# Patient Record
Sex: Male | Born: 1980
Health system: Southern US, Community
[De-identification: ages and names within clinical notes are randomized; demographics above are authoritative.]

## PROBLEM LIST (undated history)

## (undated) DIAGNOSIS — I2510 Atherosclerotic heart disease of native coronary artery without angina pectoris: Secondary | ICD-10-CM

## (undated) DIAGNOSIS — E7800 Pure hypercholesterolemia, unspecified: Secondary | ICD-10-CM

## (undated) DIAGNOSIS — F1910 Other psychoactive substance abuse, uncomplicated: Secondary | ICD-10-CM

## (undated) DIAGNOSIS — F32A Depression, unspecified: Secondary | ICD-10-CM

## (undated) DIAGNOSIS — F329 Major depressive disorder, single episode, unspecified: Secondary | ICD-10-CM

## (undated) HISTORY — PX: SURGERY SCROTAL / TESTICULAR: SUR1316

---

## 2019-03-07 ENCOUNTER — Other Ambulatory Visit: Payer: Self-pay

## 2019-03-07 ENCOUNTER — Encounter (HOSPITAL_COMMUNITY): Payer: Self-pay | Admitting: *Deleted

## 2019-03-07 ENCOUNTER — Emergency Department (HOSPITAL_COMMUNITY)
Admission: EM | Admit: 2019-03-07 | Discharge: 2019-03-08 | Disposition: A | Discharge status: Other Acute Inpatient Hospital | Payer: Self-pay | Attending: Emergency Medicine | Admitting: Emergency Medicine

## 2019-03-07 DIAGNOSIS — F17210 Nicotine dependence, cigarettes, uncomplicated: Secondary | ICD-10-CM | POA: Insufficient documentation

## 2019-03-07 DIAGNOSIS — R45851 Suicidal ideations: Secondary | ICD-10-CM | POA: Insufficient documentation

## 2019-03-07 DIAGNOSIS — R441 Visual hallucinations: Secondary | ICD-10-CM | POA: Insufficient documentation

## 2019-03-07 DIAGNOSIS — F333 Major depressive disorder, recurrent, severe with psychotic symptoms: Secondary | ICD-10-CM | POA: Insufficient documentation

## 2019-03-07 DIAGNOSIS — Z046 Encounter for general psychiatric examination, requested by authority: Secondary | ICD-10-CM | POA: Insufficient documentation

## 2019-03-07 HISTORY — DX: Major depressive disorder, single episode, unspecified: F32.9

## 2019-03-07 HISTORY — DX: Depression, unspecified: F32.A

## 2019-03-07 HISTORY — DX: Pure hypercholesterolemia, unspecified: E78.00

## 2019-03-07 LAB — CBC
HCT: 47.8 % (ref 39.0–52.0)
Hemoglobin: 16.2 g/dL (ref 13.0–17.0)
MCH: 32.3 pg (ref 26.0–34.0)
MCHC: 33.9 g/dL (ref 30.0–36.0)
MCV: 95.2 fL (ref 80.0–100.0)
Platelets: 252 10*3/uL (ref 150–400)
RBC: 5.02 MIL/uL (ref 4.22–5.81)
RDW: 12.2 % (ref 11.5–15.5)
WBC: 7 10*3/uL (ref 4.0–10.5)
nRBC: 0 % (ref 0.0–0.2)

## 2019-03-07 LAB — COMPREHENSIVE METABOLIC PANEL
ALT: 158 U/L — ABNORMAL HIGH (ref 0–44)
ANION GAP: 10 (ref 5–15)
AST: 71 U/L — ABNORMAL HIGH (ref 15–41)
Albumin: 5.1 g/dL — ABNORMAL HIGH (ref 3.5–5.0)
Alkaline Phosphatase: 46 U/L (ref 38–126)
BUN: 17 mg/dL (ref 6–20)
CO2: 27 mmol/L (ref 22–32)
Calcium: 9.8 mg/dL (ref 8.9–10.3)
Chloride: 102 mmol/L (ref 98–111)
Creatinine, Ser: 0.74 mg/dL (ref 0.61–1.24)
GFR calc Af Amer: >60 mL/min (ref >60)
GFR calc non Af Amer: >60 mL/min (ref >60)
Glucose, Bld: 102 mg/dL — ABNORMAL HIGH (ref 70–99)
Potassium: 3.8 mmol/L (ref 3.5–5.1)
Sodium: 139 mmol/L (ref 135–145)
Total Bilirubin: 1.5 mg/dL — ABNORMAL HIGH (ref 0.3–1.2)
Total Protein: 8.6 g/dL — ABNORMAL HIGH (ref 6.5–8.1)

## 2019-03-07 LAB — RAPID URINE DRUG SCREEN, HOSP PERFORMED
Amphetamines: POSITIVE — AB
Barbiturates: NOT DETECTED
Benzodiazepines: NOT DETECTED
Cocaine: NOT DETECTED
Opiates: NOT DETECTED
Tetrahydrocannabinol: NOT DETECTED

## 2019-03-07 LAB — ACETAMINOPHEN LEVEL: Acetaminophen (Tylenol), Serum: <10 ug/mL — ABNORMAL LOW (ref 10–30)

## 2019-03-07 LAB — ETHANOL

## 2019-03-07 LAB — SALICYLATE LEVEL: Salicylate Lvl: <7 mg/dL (ref 2.8–30.0)

## 2019-03-07 NOTE — ED Notes (Signed)
Pt alert and oriented, pt denies any pain or discomfort. Pt denies any hi and avh. Pt c/o si without a plan. Pt noted with a flat affect. Pt responds to questions slowly. Pt contract to safety, will continue to monitor.

## 2019-03-07 NOTE — ED Triage Notes (Signed)
Pt brought in by [employer].  Pt stated "I was supposed to see someone for an assessment last Monday but I didn't.  I don't work because I keep getting arrested because I date or marry women that see me as refuse or easily discarded.  I'm just tired of life.  I told my parents I was tired of being afraid and I just didn't want to do this anymore."  Pt denies having a plan to harm self.
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
## 2019-03-07 NOTE — BHH Counselor (Signed)
Pt reported, he does not have any family, friend supports clinician could contact to obtain collateral information.   Jon Peterson D [name], MS, [facility], CRC Triage Specialist [phone]  

## 2019-03-08 ENCOUNTER — Inpatient Hospital Stay (HOSPITAL_COMMUNITY)
Admission: AD | Admit: 2019-03-08 | Discharge: 2019-03-10 | DRG: 885 | Disposition: A | Discharge status: Home / Self Care | Payer: No Typology Code available for payment source | Source: Intra-hospital | Attending: Psychiatry | Admitting: Psychiatry

## 2019-03-08 ENCOUNTER — Encounter (HOSPITAL_COMMUNITY): Payer: Self-pay

## 2019-03-08 DIAGNOSIS — R45851 Suicidal ideations: Secondary | ICD-10-CM | POA: Diagnosis present

## 2019-03-08 DIAGNOSIS — Z915 Personal history of self-harm: Secondary | ICD-10-CM | POA: Diagnosis not present

## 2019-03-08 DIAGNOSIS — Z23 Encounter for immunization: Secondary | ICD-10-CM | POA: Diagnosis not present

## 2019-03-08 DIAGNOSIS — F1510 Other stimulant abuse, uncomplicated: Secondary | ICD-10-CM | POA: Diagnosis present

## 2019-03-08 DIAGNOSIS — F333 Major depressive disorder, recurrent, severe with psychotic symptoms: Secondary | ICD-10-CM | POA: Diagnosis present

## 2019-03-08 DIAGNOSIS — F17210 Nicotine dependence, cigarettes, uncomplicated: Secondary | ICD-10-CM | POA: Diagnosis present

## 2019-03-08 MED ORDER — ONDANSETRON HCL 4 MG PO TABS: 4.0000 mg | ORAL_TABLET | Freq: Three times a day (TID) | ORAL | Status: DC | PRN

## 2019-03-08 MED ORDER — MODAFINIL 100 MG PO TABS
100.0000 mg | ORAL_TABLET | Freq: Every day | ORAL | Status: DC
  Administered 2019-03-08 – 2019-03-10 (×3): 100 mg via ORAL
  Filled 2019-03-08 (×3): qty 1

## 2019-03-08 MED ORDER — NICOTINE 21 MG/24HR TD PT24
21.0000 mg | MEDICATED_PATCH | Freq: Every day | TRANSDERMAL | Status: DC
  Administered 2019-03-08 – 2019-03-10 (×3): 21 mg via TRANSDERMAL
  Filled 2019-03-08 (×6): qty 1

## 2019-03-08 MED ORDER — ALUM & MAG HYDROXIDE-SIMETH 200-200-20 MG/5ML PO SUSP: 30.0000 mL | ORAL | Status: DC | PRN

## 2019-03-08 MED ORDER — MAGNESIUM HYDROXIDE 400 MG/5ML PO SUSP: 30.0000 mL | Freq: Every day | ORAL | Status: DC | PRN

## 2019-03-08 MED ORDER — TRAZODONE HCL 100 MG PO TABS
100.0000 mg | ORAL_TABLET | Freq: Every evening | ORAL | Status: DC | PRN
  Administered 2019-03-08 – 2019-03-09 (×2): 100 mg via ORAL
  Filled 2019-03-08 (×7): qty 1

## 2019-03-08 MED ORDER — ALUM & MAG HYDROXIDE-SIMETH 200-200-20 MG/5ML PO SUSP: 30.0000 mL | Freq: Four times a day (QID) | ORAL | Status: DC | PRN

## 2019-03-08 MED ORDER — PNEUMOCOCCAL VAC POLYVALENT 25 MCG/0.5ML IJ INJ
0.5000 mL | INJECTION | INTRAMUSCULAR | Status: AC
  Administered 2019-03-09: 0.5 mL via INTRAMUSCULAR

## 2019-03-08 MED ORDER — PRENATAL MULTIVITAMIN CH
1.0000 | ORAL_TABLET | Freq: Every day | ORAL | Status: DC
  Administered 2019-03-08 – 2019-03-10 (×3): 1 via ORAL
  Filled 2019-03-08 (×4): qty 1

## 2019-03-08 MED ORDER — HYDROXYZINE HCL 25 MG PO TABS
25.0000 mg | ORAL_TABLET | Freq: Four times a day (QID) | ORAL | Status: DC | PRN
  Administered 2019-03-08 – 2019-03-09 (×2): 25 mg via ORAL
  Filled 2019-03-08 (×2): qty 1

## 2019-03-08 MED ORDER — IBUPROFEN 200 MG PO TABS: 600.0000 mg | ORAL_TABLET | Freq: Three times a day (TID) | ORAL | Status: DC | PRN

## 2019-03-08 MED ORDER — INFLUENZA VAC SPLIT QUAD 0.5 ML IM SUSY
0.5000 mL | PREFILLED_SYRINGE | INTRAMUSCULAR | Status: AC
  Administered 2019-03-09: 0.5 mL via INTRAMUSCULAR
  Filled 2019-03-08: qty 0.5

## 2019-03-08 MED ORDER — FLUOXETINE HCL 20 MG PO CAPS
20.0000 mg | ORAL_CAPSULE | Freq: Every day | ORAL | Status: DC
  Administered 2019-03-08 – 2019-03-10 (×3): 20 mg via ORAL
  Filled 2019-03-08 (×5): qty 1

## 2019-03-08 MED ORDER — TRAZODONE HCL 50 MG PO TABS
50.0000 mg | ORAL_TABLET | Freq: Every evening | ORAL | Status: DC | PRN
  Filled 2019-03-08 (×2): qty 1

## 2019-03-08 MED ORDER — NICOTINE 21 MG/24HR TD PT24: 21.0000 mg | MEDICATED_PATCH | Freq: Every day | TRANSDERMAL | Status: DC

## 2019-03-08 MED ORDER — ACETAMINOPHEN 325 MG PO TABS: 650.0000 mg | ORAL_TABLET | Freq: Four times a day (QID) | ORAL | Status: DC | PRN
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
## 2019-03-08 NOTE — Progress Notes (Addendum)
Jon Peterson attended wrap-up group. Pt denies SI/HI/AVH/Pain at this time. Pt appears flat/restless/irritable/anxious in affect and mood. Pt requesting  medication for anxiety and sleep. No new c/o's. No behavioral issues noted. Support and encouragement provided. Will continue with POC.

## 2019-03-08 NOTE — Progress Notes (Signed)
Recreation Therapy Notes  INPATIENT RECREATION THERAPY ASSESSMENT  Patient Details Name: Jon Peterson MRN: [id number] DOB: 02/16/1981 Today's Date: 03/08/2019       Information Obtained From: Patient  Able to Participate in Assessment/Interview: Yes  Patient Presentation: Alert, Anxious  Reason for Admission (Per Patient): Suicidal Ideation  Patient Stressors: Family, Relationship, Friends, Work, Other (Comment)(Legal system)  Coping Skills:   Exercise, Substance Abuse, Avoidance  Leisure Interests (2+):  Individual - Other (Comment)(Drugs)  Frequency of Recreation/Participation: Other (Comment)(Pt stated "not often lately because of probation")  Awareness of Community Resources:  Yes  Community Resources:  (Pt was unable to name them)  Current Use:    If no, Barriers?:    Expressed Interest in [employer] Information: No  [place] of Residence:  [place]  Patient Main Form of Transportation: Walk  Patient Strengths:  "I don't know"  Patient Identified Areas of Improvement:  Better choice of people; Leave this state  Patient Goal for Hospitalization:  "Never get out"  Current SI (including self-harm):  No  Current HI:  No  Current AVH: No  Staff Intervention Plan: Group Attendance, Collaborate with Interdisciplinary Treatment Team  Consent to Intern Participation: N/A    Caroll Rancher, LRT/CTRS  Caroll Rancher A 03/08/2019, 1:57 PM
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
## 2019-03-08 NOTE — Tx Team (Signed)
Initial Treatment Plan 03/08/2019 12:17 PM Jon Peterson LJQ:[id number]    PATIENT STRESSORS: Financial difficulties Health problems Marital or family conflict Substance abuse   PATIENT STRENGTHS: Capable of independent living Manufacturing systems engineer Motivation for treatment/growth Supportive family/friends   PATIENT IDENTIFIED PROBLEMS: "stop having suicidal thoughts"  "get clean"                   DISCHARGE CRITERIA:  Ability to meet basic life and health needs Improved stabilization in mood, thinking, and/or behavior Motivation to continue treatment in a less acute level of care  PRELIMINARY DISCHARGE PLAN: Attend 12-step recovery group Outpatient therapy Return to previous living arrangement  PATIENT/FAMILY INVOLVEMENT: This treatment plan has been presented to and reviewed with the patient, Jon Peterson.  The patient and family have been given the opportunity to ask questions and make suggestions.  Raylene Miyamoto, RN 03/08/2019, 12:17 PM

## 2019-03-08 NOTE — Progress Notes (Signed)
Adult Psychoeducational Group Note  Date:  03/08/2019 Time:  8:58 PM  Group Topic/Focus:  Wrap-Up Group:   The focus of this group is to help patients review their daily goal of treatment and discuss progress on daily workbooks.  Participation Level:  Active  Participation Quality:  Appropriate  Affect:  Appropriate  Cognitive:  Appropriate  Insight: Appropriate  Engagement in Group:  Engaged  Modes of Intervention:  Discussion  Additional Comments: The patient expressed that he rates today a 8.The patient also said that he attended all groups.  Octavio Manns 03/08/2019, 8:58 PM

## 2019-03-08 NOTE — [facility] Suicide Risk Assessment (Signed)
[facility] Admission Suicide Risk Assessment   Total Time spent with patient: 45 minutes Principal Problem: MDD Diagnosis:  Active Problems:   MDD (major depressive disorder), recurrent, severe, with psychosis (HCC)  Subjective Data: c/o si recurrent stress  Continued Clinical Symptoms:    The "Alcohol Use Disorders Identification Test", Guidelines for Use in Primary Care, Second Edition.  World Science writer [facility]). Score between 0-7:  no or low risk or alcohol related problems. Score between 8-15:  moderate risk of alcohol related problems. Score between 16-19:  high risk of alcohol related problems. Score 20 or above:  warrants further diagnostic evaluation for alcohol dependence and treatment.   CLINICAL FACTORS:   Depression:   Severe    COGNITIVE FEATURES THAT CONTRIBUTE TO RISK:  None    SUICIDE RISK:   Mild:  Suicidal ideation of limited frequency, intensity, duration, and specificity.  There are no identifiable plans, no associated intent, mild dysphoria and related symptoms, good self-control (both objective and subjective assessment), few other risk factors, and identifiable protective factors, including available and accessible social support.  PLAN OF CARE: cont eval- treat depression  I certify that inpatient services furnished can reasonably be expected to improve the patient's condition.   Malvin Johns, MD 03/08/2019, 11:26 AM

## 2019-03-08 NOTE — [facility] Suicide Risk Assessment (Signed)
[facility] INPATIENT:  Family/Significant Other Suicide Prevention Education  Suicide Prevention Education:  Patient Refusal for Family/Significant Other Suicide Prevention Education: The patient Jon Peterson has refused to provide written consent for family/significant other to be provided Family/Significant Other Suicide Prevention Education during admission and/or prior to discharge.  Physician notified.  CSW reviewed with patient suicide prevention education and mobile crisis number in the area. CSW provided patient with handout with the above information.   Shellia Cleverly 03/08/2019, 4:01 PM
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
## 2019-03-08 NOTE — BH Assessment (Signed)
[facility] Assessment Progress Note  Per Donell Sievert, PA, this pt requires psychiatric hospitalization at this time.  Selena Batten, RN, [facility] has assigned pt to [facility] Rm 501-1; [facility] will be ready to receive pt after 09:30.  Pt has signed Voluntary Admission and Consent for Treatment.  Pt's nurse, Kendal Hymen, is aware of pt's disposition, and agrees to send original paperwork along with pt via Juel Burrow, and to call report to [phone].  Doylene Canning, [place] Behavioral Health Coordinator [phone]

## 2019-03-08 NOTE — [place] Notes (Signed)
Patient ID: Jon Peterson, male   DOB: 1981-04-05, 38 y.o.   MRN: [id number] Admission Note  Pt is a 38 yo male that presents voluntarily on 03/08/2019 with worsening depression, anxiety, suicidal ideations, and substance abuse. Pt states that his last use of meth was about a week ago, and he has been depressed, having thoughts to kill himself with no plan. Pt stated that his father is his only support, but he lives in [place]. "that's who called the cops when I said I was suicidal". Pt denies si/hi/ah/vh at this moment and verbally agrees to approach staff if these become apparent or before harming himself others while at [facility]. Pt denies ever having a plan. Pt denies having a pcp or dentist. Pt denies home medication abuse/use. Pt states he smokes 1ppd. Pt denies alcohol use. Pt denies physical/verbal/sexual abuse either past/present. Pt states his stressors are financial, past wife, ex girlfriend, "just life". 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 Pt states he has a court date on April 21st. Pt is anxious and fidgety in his affect/manuerisms during assessment with poor eye contact. Pt's skin search was unremarkable with multiple tattoos. Pt safe on the unit. q39m safety checks implemented and continued. Will continue to monitor.   Consents signed, skin/belongings search completed and patient oriented to unit. Patient stable at this time. Patient given the opportunity to express concerns and ask questions. Patient given toiletries. Will continue to monitor.

## 2019-03-08 NOTE — [facility] Group Notes (Signed)
Occupational Therapy Group Note  Date:  03/08/2019 Time:  3:21 PM  Group Topic/Focus:  Self Esteem Action Plan:   The focus of this group is to help patients create a plan to continue to build self-esteem after discharge.  Participation Level:  Active  Participation Quality:  Appropriate  Affect:  Flat  Cognitive:  Alert  Insight: Limited  Engagement in Group:  Engaged  Modes of Intervention:  Activity, Discussion, Education and Socialization  Additional Comments:    S: "Scoiety expectation and joblessness can decrease your self esteem"   O: Education given on self esteem and its positive and negative implications in daily life. Pt asked to share in relation to personal experiences. Art activity to be completed to build positive thinking.   A: Pt presents to group with flat affect, with facial and head movements when speaking. Pt sharing that society expectations and stigma can decrease his self esteem. He also shares how since being arrested he has had difficulty in finding a job, despite having a bachelors degree. He shares that accomplishing goals can increase your self esteem. Pt colored art activity, but did not place any positive words describing himself on it. When questioned, pt states "well there really are not any positive qualities about me". Continued o challenge pt and give cues for positive traits, pt appreciative but having difficulty maintaining that positivity.   P: OT group will be x1 per week while pt inpatient.  Dalphine Handing, MSOT, OTR/L Behavioral Health OT/ Acute Relief OT PHP Office: [phone]  Dalphine Handing 03/08/2019, 3:21 PM

## 2019-03-09 LAB — HEPATITIS PANEL, ACUTE
HCV Ab: >11 s/co ratio — ABNORMAL HIGH (ref 0.0–0.9)
Hep A IgM: NEGATIVE
Hep B C IgM: NEGATIVE
Hepatitis B Surface Ag: NEGATIVE

## 2019-03-09 NOTE — [place] Notes (Signed)
Jon Peterson attended wrap-up group. Pt denies SI/HI/AVH/Pain at this time. Pt appears flat/restless/anxious in affect and mood. Pt requesting PRN for anxiety and sleep. No new c/o's. No behavioral issues noted. 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 Support and encouragement provided. Will continue with POC.

## 2019-03-09 NOTE — [place] Notes (Signed)
Recreation Therapy Notes  Date: 3.26.20 Time: 1000 Location: 500 Hall Dayroom  Group Topic: Wellness  Goal Area(s) Addresses:  Patient will define components of whole wellness. Patient will verbalize benefit of whole wellness.  Intervention:  Music   Activity:  Exercise.  LRT led the group in a series of stretches to loosen them up.  Patients were then given the opportunity to lead the group in an exercise/dance of their choice.  Patients were to get at least 30 minutes of exercise.  Patients could take water breaks as needed.  Education: Wellness, Building control surveyor.   Education Outcome: Acknowledges education/In group clarification offered/Needs additional education.   Clinical Observations/Feedback:  Pt did not attend group.     Caroll Rancher, LRT/CTRS         Lillia Abed, Camira Geidel A 03/09/2019 11:02 AM

## 2019-03-09 NOTE — [place] Notes (Signed)
Adult Psychoeducational Group Note  Date:  03/09/2019 Time:  8:55 PM  Group Topic/Focus:  Wrap-Up Group:   The focus of this group is to help patients review their daily goal of treatment and discuss progress on daily workbooks.  Participation Level:  Active  Participation Quality:  Appropriate  Affect:  Appropriate  Cognitive:  Appropriate  Insight: Appropriate  Engagement in Group:  Engaged  Modes of Intervention:  Discussion  Additional Comments:  The patient expressed that he rates today a 6.The patient also said that he attended group today.  Octavio Manns 03/09/2019, 8:55 PM

## 2019-03-09 NOTE — [facility] Group Notes (Signed)
[facility] LCSW Group Therapy Note  Date/Time: 03/09/19, 1315  Type of Therapy/Topic:  Group Therapy:  Balance in Life  Participation Level:  active  Description of Group:    This group will address the concept of balance and how it feels and looks when one is unbalanced. Patients will be encouraged to process areas in their lives that are out of balance, and identify reasons for remaining unbalanced. Facilitators will guide patients utilizing problem- solving interventions to address and correct the stressor making their life unbalanced. Understanding and applying boundaries will be explored and addressed for obtaining  and maintaining a balanced life. Patients will be encouraged to explore ways to assertively make their unbalanced needs known to significant others in their lives, using other group members and facilitator for support and feedback.  Therapeutic Goals: 1. Patient will identify two or more emotions or situations they have that consume much of in their lives. 2. Patient will identify signs/triggers that life has become out of balance:  3. Patient will identify two ways to set boundaries in order to achieve balance in their lives:  4. Patient will demonstrate ability to communicate their needs through discussion and/or role plays  Summary of Patient Progress: Pt active and engaged during group today.  Said that areas of his life that are out of balance are too little work, as he has been incarcerated very frequently over the past few years and unable to maintain a job, and also family, as he is engaged in a custody battle with his ex wife regarding their two year old daughter.  Pt made a number of comments, including that he wanted to be a history teacher, and did a nice job participating in group today.          Therapeutic Modalities:   Cognitive Behavioral Therapy Solution-Focused Therapy Assertiveness Training  Daleen Squibb, [place]

## 2019-03-09 NOTE — [place] Notes (Signed)
[facility] MD [place] Note  03/09/2019 11:04 AM Jon Peterson  MRN:  [id number] Subjective:    Patient in bed states is a little early to tell if the medication is helpful but understands the purpose of her medications. Thus participate minimally in cognitive-based therapy Vague thoughts of not wanting to be here no acute mania no thoughts of harming others.  No psychosis  Principal Problem:  Diagnosis: Active Problems:   MDD (major depressive disorder), recurrent, severe, with psychosis (HCC)  Total Time spent with patient: 20 minutes Past Medical History:  Past Medical History:  Diagnosis Date  . Depression   . Hypercholesterolemia     Past Surgical History:  Procedure Laterality Date  . SURGERY SCROTAL / TESTICULAR     Family History: History reviewed. No pertinent family history.  Social History:  Social History   Substance and Sexual Activity  Alcohol Use Not Currently     Social History   Substance and Sexual Activity  Drug Use Yes  . Types: Amphetamines   Comment: "last meth use a week ago"    Social History   Socioeconomic History  . Marital status: Legally Separated    Spouse name: Not on file  . Number of children: Not on file  . Years of education: Not on file  . Highest education level: Not on file  Occupational History  . Not on file  Social Needs  . Financial resource strain: Not on file  . Food insecurity:    Worry: Not on file    Inability: Not on file  . Transportation needs:    Medical: Not on file    Non-medical: Not on file  Tobacco Use  . Smoking status: Current Every Day Smoker    Packs/day: 1.00  . Smokeless tobacco: Former Engineer, water and Sexual Activity  . Alcohol use: Not Currently  . Drug use: Yes    Types: Amphetamines    Comment: "last meth use a week ago"  . Sexual activity: Not Currently  Lifestyle  . Physical activity:    Days per week: Not on file    Minutes per session: Not on file  . Stress: Not on file   Relationships  . Social connections:    Talks on phone: Not on file    Gets together: Not on file    Attends religious service: Not on file    Active member of club or organization: Not on file    Attends meetings of clubs or organizations: Not on file    Relationship status: Not on file  Other Topics Concern  . Not on file  Social History Narrative  . Not on file   Sleep: Good  Appetite:  Good  Current Medications: Current Facility-Administered Medications  Medication Dose Route Frequency Provider Last Rate Last Dose  . acetaminophen (TYLENOL) tablet 650 mg  650 mg Oral Q6H PRN Kerry Hough, PA-C      . alum & mag hydroxide-simeth (MAALOX/MYLANTA) 200-200-20 MG/5ML suspension 30 mL  30 mL Oral Q4H PRN Kerry Hough, PA-C      . FLUoxetine (PROZAC) capsule 20 mg  20 mg Oral Daily Malvin Johns, MD   20 mg at 03/09/19 4098  . hydrOXYzine (ATARAX/VISTARIL) tablet 25 mg  25 mg Oral Q6H PRN Kerry Hough, PA-C   25 mg at 03/08/19 2124  . magnesium hydroxide (MILK OF MAGNESIA) suspension 30 mL  30 mL Oral Daily PRN Kerry Hough, PA-C      .  modafinil (PROVIGIL) tablet 100 mg  100 mg Oral Daily Malvin Johns, MD   100 mg at 03/09/19 1610  . nicotine (NICODERM CQ - dosed in mg/24 hours) patch 21 mg  21 mg Transdermal Daily Malvin Johns, MD   21 mg at 03/09/19 1051  . prenatal multivitamin tablet 1 tablet  1 tablet Oral Q1200 Malvin Johns, MD   1 tablet at 03/08/19 1258  . traZODone (DESYREL) tablet 100 mg  100 mg Oral QHS,MR X 1 Malvin Johns, MD   100 mg at 03/08/19 2124    Lab Results:  Results for orders placed or performed during the hospital encounter of 03/07/19 (from the past 48 hour(s))  Comprehensive metabolic panel     Status: Abnormal   Collection Time: 03/07/19 10:30 PM  Result Value Ref Range   Sodium 139 135 - 145 mmol/L   Potassium 3.8 3.5 - 5.1 mmol/L   Chloride 102 98 - 111 mmol/L   CO2 27 22 - 32 mmol/L   Glucose, Bld 102 (H) 70 - 99 mg/dL   BUN 17 6 -  20 mg/dL   Creatinine, Ser 9.60 0.61 - 1.24 mg/dL   Calcium 9.8 8.9 - 45.4 mg/dL   Total Protein 8.6 (H) 6.5 - 8.1 g/dL   Albumin 5.1 (H) 3.5 - 5.0 g/dL   AST 71 (H) 15 - 41 U/L   ALT 158 (H) 0 - 44 U/L   Alkaline Phosphatase 46 38 - 126 U/L   Total Bilirubin 1.5 (H) 0.3 - 1.2 mg/dL   GFR calc non Af Amer >60 >60 mL/min   GFR calc Af Amer >60 >60 mL/min   Anion gap 10 5 - 15    Comment: Performed at [facility], [address], [place], [place] [address]  Ethanol     Status: None   Collection Time: 03/07/19 10:30 PM  Result Value Ref Range   Alcohol, Ethyl (B) <10 <10 mg/dL    Comment: (NOTE) Lowest detectable limit for serum alcohol is 10 mg/dL. For medical purposes only. Performed at [facility], [address], [place], [place] [address]   Salicylate level     Status: None   Collection Time: 03/07/19 10:30 PM  Result Value Ref Range   Salicylate Lvl <7.0 2.8 - 30.0 mg/dL    Comment: Performed at [facility], [address] [place], [place] [address]  Acetaminophen level     Status: Abnormal   Collection Time: 03/07/19 10:30 PM  Result Value Ref Range   Acetaminophen (Tylenol), Serum <10 (L) 10 - 30 ug/mL    Comment: (NOTE) Therapeutic concentrations vary significantly. A range of 10-30 ug/mL  may be an effective concentration for many patients. However, some  are best treated at concentrations outside of this range. Acetaminophen concentrations >150 ug/mL at 4 hours after ingestion  and >50 ug/mL at 12 hours after ingestion are often associated with  toxic reactions. Performed at [facility], [address] [place], [place] [address]   cbc     Status: None   Collection Time: 03/07/19 10:30 PM  Result Value Ref Range   WBC 7.0 4.0 - 10.5 K/uL   RBC 5.02 4.22 - 5.81 MIL/uL   Hemoglobin 16.2 13.0 - 17.0 g/dL   HCT 57.8 46.9 - 62.9 %   MCV 95.2 80.0 - 100.0 fL   MCH 32.3 26.0 - 34.0 pg    MCHC 33.9 30.0 - 36.0 g/dL   RDW  12.2 11.5 - 15.5 %   Platelets 252 150 - 400 K/uL   nRBC 0.0 0.0 - 0.2 %    Comment: Performed at [facility], [address], [place], [place] [address]  Rapid urine drug screen (hospital performed)     Status: Abnormal   Collection Time: 03/07/19 10:41 PM  Result Value Ref Range   Opiates NONE DETECTED NONE DETECTED   Cocaine NONE DETECTED NONE DETECTED   Benzodiazepines NONE DETECTED NONE DETECTED   Amphetamines POSITIVE (A) NONE DETECTED   Tetrahydrocannabinol NONE DETECTED NONE DETECTED   Barbiturates NONE DETECTED NONE DETECTED    Comment: (NOTE) DRUG SCREEN FOR MEDICAL PURPOSES ONLY.  IF CONFIRMATION IS NEEDED FOR ANY PURPOSE, NOTIFY LAB WITHIN 5 DAYS. LOWEST DETECTABLE LIMITS FOR URINE DRUG SCREEN Drug Class                     Cutoff (ng/mL) Amphetamine and metabolites    1000 Barbiturate and metabolites    200 Benzodiazepine                 200 Tricyclics and metabolites     300 Opiates and metabolites        300 Cocaine and metabolites        300 THC                            50 Performed at [facility], [address] [place], [place] [address]   Hepatitis panel, acute     Status: Abnormal   Collection Time: 03/08/19  4:59 AM  Result Value Ref Range   Hepatitis B Surface Ag Negative Negative   HCV Ab >11.0 (H) 0.0 - 0.9 s/co ratio    Comment: (NOTE)                                  Negative:     < 0.8                             Indeterminate: 0.8 - 0.9                                  Positive:     > 0.9 The CDC recommends that a positive HCV antibody result be followed up with a HCV Nucleic Acid Amplification test ([id number]). Performed At: [facility] [address] [place], [place] [id number] Jolene Schimke MD ND:[id number]    Hep A IgM Negative Negative   Hep B C IgM Negative Negative    Blood Alcohol level:  Lab Results  Component Value Date   ETH <10 03/07/2019     Metabolic Disorder Labs: No results found for: HGBA1C, MPG No results found for: PROLACTIN No results found for: CHOL, TRIG, HDL, CHOLHDL, VLDL, LDLCALC  Physical Findings: AIMS: Facial and Oral Movements Muscles of Facial Expression: None, normal Lips and Perioral Area: None, normal Jaw: None, normal Tongue: None, normal,Extremity Movements Upper (arms, wrists, hands, fingers): None, normal Lower (legs, knees, ankles, toes): None, normal, Trunk Movements Neck, shoulders, hips: None, normal, Overall Severity Severity of abnormal movements (highest score from questions above): None, normal Incapacitation due to abnormal movements: None, normal Patient's awareness of abnormal movements (rate only patient's report): No Awareness,  Dental Status Current problems with teeth and/or dentures?: No Does patient usually wear dentures?: No  CIWA:    COWS:     Musculoskeletal: Strength & Muscle Tone: within normal limits Gait & Station: normal Patient leans: N/A  Psychiatric Specialty Exam: Physical Exam  ROS  Blood pressure (!) 109/57, pulse 78, temperature 98.6 F (37 C), temperature source Oral, resp. rate 18, height 5\' 8"  (1.727 m), weight 84.4 kg, SpO2 99 %.Body mass index is 28.28 kg/m.  General Appearance: Fairly Groomed  Eye Contact:  Good  Speech:  Slow  Volume:  Decreased  Mood:  Anxious  Affect:  Flat  Thought Process:  Goal Directed  Orientation:  Full (Time, Place, and Person)  Thought Content:  Logical  Suicidal Thoughts:  Vague - contracts here  Homicidal Thoughts:  No  Memory:  Immediate;   Fair  Judgement:  Intact  Insight:  Fair  Psychomotor Activity:  Normal  Concentration:  Concentration: Fair  Recall:  [employer] of Knowledge:  Fair  Language:  Fair  Akathisia:  Negative  Handed:  Right  AIMS (if indicated):     Assets:  Resilience Social Support  ADL's:  Intact  Cognition:  WNL  Sleep:  Number of Hours: 6     Treatment Plan Summary: Daily  contact with patient to assess and evaluate symptoms and progress in treatment, Medication management and Plan Continue cognitive and rehab based therapy continue reality-based therapies continue current precautions  Coni Homesley, MD 03/09/2019, 11:04 AM

## 2019-03-09 NOTE — [place] Notes (Signed)
D: Patient was drowsy earlier.  He is currently pacing the hallway reading a [employer] novel.  He is pleasant with staff.  He is compliant with his medications.  He was given a flu and pneumonia injection, and he tolerated it well.  Patient denies any psychosis.  He does not appear to responding to internal stimuli.  He does not appear to be in any physical distress.    A: Continue to monitor medication management and MD orders.  Safety checks completed every 15 minutes per protocol.  Offer support and encouragement as needed.  R: Patient is receptive to staff; his behavior is appropriate.

## 2019-03-10 MED ORDER — FLUOXETINE HCL 20 MG PO CAPS: 20.0000 mg | ORAL_CAPSULE | Freq: Every day | ORAL | 1 refills | Status: DC
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
## 2019-03-10 NOTE — Progress Notes (Signed)
CSW spoke with pt about discharge plan.  Pt reports he was court ordered to go to [facility] for something and he missed an appt there several weeks ago.  CSW agreed to look into this.  We discussed that other services at [facility] have long wait times currently--pt agreeable to go to [facility] for med mgmt initially and then he can talk about possible med mgmt at [facility] when he returns there.  Chyree Counts contacted [facility].  Pt was referred for anger management, next class end of April.    Garner Nash, MSW, LCSW Clinical Social Worker 03/10/2019 9:40 AM

## 2019-03-10 NOTE — Progress Notes (Signed)
Pt denies SI/HI. Pt received both written and verbal discharge instruction. Pt verbalized understanding of discharge instructions. Pt agreed to f/u appt and med regimen. Pt received prescription, sample med. SRA, AVS and a transitional record. Pt gathered belongings from room and locker. Pt was provided with a bus pass per CSW. Pt safely discharged to the lobby.

## 2019-03-10 NOTE — [facility] Suicide Risk Assessment (Signed)
[facility] Discharge Suicide Risk Assessment   Principal Problem:/Depression discharge Diagnoses: Active Problems:   MDD (major depressive disorder), recurrent, severe, with psychosis (HCC)   Total Time spent with patient: 45 minutes Alert oriented and cooperative affect constricted no thoughts of harming self no cravings tremors or withdrawal  Mental Status Per Nursing Assessment::   On Admission:  Suicidal ideation indicated by patient, Self-harm thoughts  Demographic Factors:  Male and Caucasian  Loss Factors: Decrease in vocational status  Historical Factors: NA  Risk Reduction Factors:   Employed  Depression and substance abuse Cognitive Features That Contribute To Risk:  None    Suicide Risk:  Minimal: No identifiable suicidal ideation.  Patients presenting with no risk factors but with morbid ruminations; may be classified as minimal risk based on the severity of the depressive symptoms    Plan Of Care/Follow-up recommendations:  Activity:  full  Jon Waren, MD 03/10/2019, 9:34 AM

## 2019-03-10 NOTE — Progress Notes (Signed)
  [facility] Adult Case Management Discharge Plan :  Will you be returning to the same living situation after discharge:  Yes,  own home At discharge, do you have transportation home?: No. Bus pass provided.  Do you have the ability to pay for your medications: No. Will work with [employer].   Release of information consent forms completed and in the chart;  Patient's signature needed at discharge.  Patient to Follow up at: Follow-up Information    [facility] Follow up on 04/06/2019.   Specialty:  Professional Counselor Why:  Please attend your anger management class on Thursday, 4/23 at 6:00p.  Contact information: [facility] [place] [address] [place] [address] [phone]        [facility] Follow up.   Why:  Hospital follow up appointment is Tuesday, 3/31 at 9:30a.  At this time your appointment will be conducted over the telephone.  Contact information: [address] [place] [address] [phone]           Next level of care provider has access to [facility] Link:no  Safety Planning and Suicide Prevention discussed: No.Pt declined consent.   Have you used any form of tobacco in the last 30 days? (Cigarettes, Smokeless Tobacco, Cigars, and/or Pipes): Patient Refused Screening  Has patient been referred to the Quitline?: Yes, faxed on 03/10/19  Patient has been referred for addiction treatment: Yes, Rachell Cipro, LCSW 03/10/2019, 12:09 PM

## 2019-03-10 NOTE — Plan of Care (Signed)
Pt attended one recreation therapy group session.    Ario Mcdiarmid, LRT/CTRS 

## 2019-03-10 NOTE — Tx Team (Signed)
Interdisciplinary Treatment and Diagnostic Plan Update  03/10/2019 Time of Session: 0911 Jon Peterson MRN: [id number]  Principal Diagnosis: <principal problem not specified>  Secondary Diagnoses: Active Problems:   MDD (major depressive disorder), recurrent, severe, with psychosis (HCC)   Current Medications:  Current Facility-Administered Medications  Medication Dose Route Frequency Provider Last Rate Last Dose  . acetaminophen (TYLENOL) tablet 650 mg  650 mg Oral Q6H PRN Kerry Hough, PA-C      . alum & mag hydroxide-simeth (MAALOX/MYLANTA) 200-200-20 MG/5ML suspension 30 mL  30 mL Oral Q4H PRN Kerry Hough, PA-C      . FLUoxetine (PROZAC) capsule 20 mg  20 mg Oral Daily Malvin Johns, MD   20 mg at 03/10/19 5883  . hydrOXYzine (ATARAX/VISTARIL) tablet 25 mg  25 mg Oral Q6H PRN Kerry Hough, PA-C   25 mg at 03/09/19 2131  . magnesium hydroxide (MILK OF MAGNESIA) suspension 30 mL  30 mL Oral Daily PRN Donell Sievert E, PA-C      . modafinil (PROVIGIL) tablet 100 mg  100 mg Oral Daily Malvin Johns, MD   100 mg at 03/10/19 0925  . nicotine (NICODERM CQ - dosed in mg/24 hours) patch 21 mg  21 mg Transdermal Daily Malvin Johns, MD   21 mg at 03/10/19 0926  . prenatal multivitamin tablet 1 tablet  1 tablet Oral Q1200 Malvin Johns, MD   1 tablet at 03/09/19 1142  . traZODone (DESYREL) tablet 100 mg  100 mg Oral QHS,MR X 1 Malvin Johns, MD   100 mg at 03/09/19 2131   PTA Medications: No medications prior to admission.    Patient Stressors: Financial difficulties Health problems Marital or family conflict Substance abuse  Patient Strengths: Capable of independent living Barrister's clerk for treatment/growth Supportive family/friends  Treatment Modalities: Medication Management, Group therapy, Case management,  1 to 1 session with clinician, Psychoeducation, Recreational therapy.   Physician Treatment Plan for Primary Diagnosis: <principal problem  not specified> Long Term Goal(s): Improvement in symptoms so as ready for discharge Improvement in symptoms so as ready for discharge   Short Term Goals: Ability to verbalize feelings will improve Ability to identify and develop effective coping behaviors will improve  Medication Management: Evaluate patient's response, side effects, and tolerance of medication regimen.  Therapeutic Interventions: 1 to 1 sessions, Unit Group sessions and Medication administration.  Evaluation of Outcomes: Adequate for Discharge  Physician Treatment Plan for Secondary Diagnosis: Active Problems:   MDD (major depressive disorder), recurrent, severe, with psychosis (HCC)  Long Term Goal(s): Improvement in symptoms so as ready for discharge Improvement in symptoms so as ready for discharge   Short Term Goals: Ability to verbalize feelings will improve Ability to identify and develop effective coping behaviors will improve     Medication Management: Evaluate patient's response, side effects, and tolerance of medication regimen.  Therapeutic Interventions: 1 to 1 sessions, Unit Group sessions and Medication administration.  Evaluation of Outcomes: Adequate for Discharge   RN Treatment Plan for Primary Diagnosis: <principal problem not specified> Long Term Goal(s): Knowledge of disease and therapeutic regimen to maintain health will improve  Short Term Goals: Ability to identify and develop effective coping behaviors will improve and Compliance with prescribed medications will improve  Medication Management: RN will administer medications as ordered by provider, will assess and evaluate patient's response and provide education to patient for prescribed medication. RN will report any adverse and/or side effects to prescribing provider.  Therapeutic Interventions: 1 on 1 counseling  sessions, Psychoeducation, Medication administration, Evaluate responses to treatment, Monitor vital signs and CBGs as  ordered, Perform/monitor CIWA, COWS, AIMS and Fall Risk screenings as ordered, Perform wound care treatments as ordered.  Evaluation of Outcomes: Adequate for Discharge   LCSW Treatment Plan for Primary Diagnosis: <principal problem not specified> Long Term Goal(s): Safe transition to appropriate next level of care at discharge, Engage patient in therapeutic group addressing interpersonal concerns.  Short Term Goals: Engage patient in aftercare planning with referrals and resources, Increase social support and Increase skills for wellness and recovery  Therapeutic Interventions: Assess for all discharge needs, 1 to 1 time with Social worker, Explore available resources and support systems, Assess for adequacy in community support network, Educate family and significant other(s) on suicide prevention, Complete Psychosocial Assessment, Interpersonal group therapy.  Evaluation of Outcomes: Adequate for Discharge   Progress in Treatment: Attending groups: Yes. Participating in groups: Yes. Taking medication as prescribed: Yes. Toleration medication: Yes. Family/Significant other contact made: No, will contact:  pt declined consent Patient understands diagnosis: Yes. Discussing patient identified problems/goals with staff: Yes. Medical problems stabilized or resolved: Yes. Denies suicidal/homicidal ideation: Yes. Issues/concerns per patient self-inventory: No. Other: none  New problem(s) identified: No, Describe:  none  New Short Term/Long Term Goal(s):  Patient Goals:    Discharge Plan or Barriers:   Reason for Continuation of Hospitalization: None.  Discharge today.   Estimated Length of Stay:discharge today.  Attendees: Patient:Jon Peterson 03/10/2019   Physician: Dr. Jeannine Kitten, MD 03/10/2019   Nursing:  03/10/2019   RN Care Manager: 03/10/2019   Social Worker: Daleen Squibb, LCSW 03/10/2019   Recreational Therapist:  03/10/2019   Other:  03/10/2019   Other:  03/10/2019    Other: 03/10/2019     Scribe for Treatment Team: Lorri Frederick, LCSW 03/10/2019 9:52 AM

## 2019-03-10 NOTE — Progress Notes (Signed)
Recreation Therapy Notes  INPATIENT RECREATION TR PLAN  Patient Details Name: Jon Peterson MRN: [id number] DOB: Dec 20, 1980 Today's Date: 03/10/2019  Rec Therapy Plan Is patient appropriate for Therapeutic Recreation?: Yes Treatment times per week: about 3 days Estimated Length of Stay: 5-7 days TR Treatment/Interventions: Group participation (Comment)  Discharge Criteria Pt will be discharged from therapy if:: Discharged Treatment plan/goals/alternatives discussed and agreed upon by:: Patient/family  Discharge Summary Short term goals set: See patient care plan Short term goals met: Not met Progress toward goals comments: Groups attended Which groups?: Leisure education Reason goals not met: Pt attended one group session. Therapeutic equipment acquired: N/A Reason patient discharged from therapy: Discharge from hospital Pt/family agrees with progress & goals achieved: Yes Date patient discharged from therapy: 03/10/19    Victorino Sparrow, LRT/CTRS   Ria Comment, Tylynn Braniff A 03/10/2019, 12:33 PM

## 2019-03-10 NOTE — Progress Notes (Signed)
Recreation Therapy Notes  Date: 3.27.20 Time: 1000 Location: 500 Hall Day Room   Group Topic: Leisure Education  Goal Area(s) Addresses:  Patient will identify positive leisure activities.  Patient will identify one positive benefit of participation in leisure activities.   Behavioral Response: Engaged  Intervention: Leisure Group Game  Activity:  Leisure IT trainer.  Patients were to take turns picking a slip of paper from a can.  Each slip of paper contained a leisure activity.  One person would draw their activity on the board and the person who guesses the correct activity, would take the next turn.  Education:  Leisure Education, Building control surveyor  Education Outcome: Acknowledges education/In group clarification offered/Needs additional education  Clinical Observations/Feedback: Patient was bright and active in the activity.  Pt was creative in drawing the leisure activities he selected.  Pt was engaged with peers and focused on the activity.    Caroll Rancher, LRT/CTRS     Caroll Rancher A 03/10/2019 12:03 PM

## 2019-03-15 ENCOUNTER — Emergency Department (HOSPITAL_COMMUNITY)
Admission: EM | Admit: 2019-03-15 | Discharge: 2019-03-16 | Disposition: A | Discharge status: Home / Self Care | Payer: Self-pay | Attending: Emergency Medicine | Admitting: Emergency Medicine

## 2019-03-15 ENCOUNTER — Encounter (HOSPITAL_COMMUNITY): Payer: Self-pay | Admitting: Emergency Medicine

## 2019-03-15 ENCOUNTER — Emergency Department (HOSPITAL_COMMUNITY): Payer: Self-pay

## 2019-03-15 ENCOUNTER — Other Ambulatory Visit: Payer: Self-pay

## 2019-03-15 DIAGNOSIS — Z79899 Other long term (current) drug therapy: Secondary | ICD-10-CM | POA: Insufficient documentation

## 2019-03-15 DIAGNOSIS — F17210 Nicotine dependence, cigarettes, uncomplicated: Secondary | ICD-10-CM | POA: Insufficient documentation

## 2019-03-15 DIAGNOSIS — R45851 Suicidal ideations: Secondary | ICD-10-CM

## 2019-03-15 DIAGNOSIS — F1594 Other stimulant use, unspecified with stimulant-induced mood disorder: Secondary | ICD-10-CM | POA: Diagnosis present

## 2019-03-15 DIAGNOSIS — F333 Major depressive disorder, recurrent, severe with psychotic symptoms: Secondary | ICD-10-CM | POA: Diagnosis present

## 2019-03-15 DIAGNOSIS — R748 Abnormal levels of other serum enzymes: Secondary | ICD-10-CM

## 2019-03-15 LAB — CBC WITH DIFFERENTIAL/PLATELET
Abs Immature Granulocytes: 0.02 10*3/uL (ref 0.00–0.07)
Basophils Absolute: 0.1 10*3/uL (ref 0.0–0.1)
Basophils Relative: 1 %
Eosinophils Absolute: 0.1 10*3/uL (ref 0.0–0.5)
Eosinophils Relative: 1 %
HCT: 40.7 % (ref 39.0–52.0)
Hemoglobin: 14.6 g/dL (ref 13.0–17.0)
Immature Granulocytes: 0 %
Lymphocytes Relative: 26 %
Lymphs Abs: 2.1 10*3/uL (ref 0.7–4.0)
MCH: 32.6 pg (ref 26.0–34.0)
MCHC: 35.9 g/dL (ref 30.0–36.0)
MCV: 90.8 fL (ref 80.0–100.0)
Monocytes Absolute: 1.1 10*3/uL — ABNORMAL HIGH (ref 0.1–1.0)
Monocytes Relative: 14 %
Neutro Abs: 4.8 10*3/uL (ref 1.7–7.7)
Neutrophils Relative %: 58 %
Platelets: 233 10*3/uL (ref 150–400)
RBC: 4.48 MIL/uL (ref 4.22–5.81)
RDW: 11.9 % (ref 11.5–15.5)
WBC: 8.2 10*3/uL (ref 4.0–10.5)
nRBC: 0 % (ref 0.0–0.2)

## 2019-03-15 LAB — COMPREHENSIVE METABOLIC PANEL
ALT: 227 U/L — ABNORMAL HIGH (ref 0–44)
AST: 192 U/L — ABNORMAL HIGH (ref 15–41)
Albumin: 4.6 g/dL (ref 3.5–5.0)
Alkaline Phosphatase: 41 U/L (ref 38–126)
Anion gap: 16 — ABNORMAL HIGH (ref 5–15)
BUN: 28 mg/dL — ABNORMAL HIGH (ref 6–20)
CO2: 20 mmol/L — ABNORMAL LOW (ref 22–32)
Calcium: 9.1 mg/dL (ref 8.9–10.3)
Chloride: 100 mmol/L (ref 98–111)
Creatinine, Ser: 0.85 mg/dL (ref 0.61–1.24)
GFR calc Af Amer: >60 mL/min (ref >60)
GFR calc non Af Amer: >60 mL/min (ref >60)
Glucose, Bld: 76 mg/dL (ref 70–99)
Potassium: 2.9 mmol/L — ABNORMAL LOW (ref 3.5–5.1)
Sodium: 136 mmol/L (ref 135–145)
Total Bilirubin: 3.6 mg/dL — ABNORMAL HIGH (ref 0.3–1.2)
Total Protein: 7.4 g/dL (ref 6.5–8.1)

## 2019-03-15 LAB — RAPID URINE DRUG SCREEN, HOSP PERFORMED
Amphetamines: POSITIVE — AB
Barbiturates: NOT DETECTED
Benzodiazepines: NOT DETECTED
Cocaine: NOT DETECTED
Opiates: NOT DETECTED
Tetrahydrocannabinol: NOT DETECTED

## 2019-03-15 LAB — SALICYLATE LEVEL: Salicylate Lvl: <7 mg/dL (ref 2.8–30.0)

## 2019-03-15 LAB — ACETAMINOPHEN LEVEL: Acetaminophen (Tylenol), Serum: <10 ug/mL — ABNORMAL LOW (ref 10–30)

## 2019-03-15 LAB — ETHANOL: Alcohol, Ethyl (B): <10 mg/dL (ref <10)

## 2019-03-15 MED ORDER — CEPHALEXIN 500 MG PO CAPS
500.0000 mg | ORAL_CAPSULE | Freq: Two times a day (BID) | ORAL | Status: DC
  Administered 2019-03-15 – 2019-03-16 (×2): 500 mg via ORAL
  Filled 2019-03-15 (×2): qty 1

## 2019-03-15 MED ORDER — POTASSIUM CHLORIDE CRYS ER 20 MEQ PO TBCR
40.0000 meq | EXTENDED_RELEASE_TABLET | Freq: Once | ORAL | Status: AC
  Administered 2019-03-15: 21:00:00 40 meq via ORAL
  Filled 2019-03-15: qty 2

## 2019-03-15 MED ORDER — SODIUM CHLORIDE 0.9 % IV BOLUS: 1000.0000 mL | Freq: Once | INTRAVENOUS | Status: DC

## 2019-03-15 NOTE — ED Notes (Signed)
ED Provider at bedside. 
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
## 2019-03-15 NOTE — ED Notes (Signed)
On admission to the acute unit pt is writhing on the bed. Has difficulty following directions, but did allow blood to be taken, gave urine, and eat dinner.
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
## 2019-03-15 NOTE — Discharge Instructions (Addendum)
Your liver enzymes were elevated today and another lab was drawn to check for hepatitis c. You were given a referral to the gastroenterology doctor. You will call this office and schedule a follow up appointment with this doctor in regards to your elevated liver enzymes and in regards to this additional test.    Please return to the emergency department for any new or worsening symptoms.  To help you maintain a sober lifestyle, a substance abuse treatment program may be beneficial to you.  Contact one of the following facilities at your earliest opportunity to ask about enrolling:  RESIDENTIAL PROGRAMS:       [facility]      [address] [place], [place] [address]      [phone]       [facility]      [address]      [place], [place] [address]      [phone]  OUTPATIENT PROGRAMS:       [facility]      [address], [place] [address]      [phone]      New patients must call to schedule an intake appointment Mondays, Wednesdays and Fridays, 1:00 pm - 3:00 pm   For your shelter needs, contact the following service providers:       [facility]      [address], [place] [address]      [phone]       [facility]      [address]      [place], [place] [address]      [phone]  For transitional housing, contact one of the following agencies.  They provide longer term housing than a shelter, but there is an application process:       [facility] [employer] [facility]      [address], [place] [address]      [phone]

## 2019-03-15 NOTE — ED Notes (Signed)
Bed: [facility] Expected date:  Expected time:  Means of arrival:  Comments: 

## 2019-03-15 NOTE — ED Notes (Signed)
Bed: [facility] Expected date:  Expected time:  Means of arrival:  Comments: Hold for triage 4

## 2019-03-16 ENCOUNTER — Encounter (HOSPITAL_COMMUNITY): Payer: Self-pay | Admitting: Emergency Medicine

## 2019-03-16 ENCOUNTER — Emergency Department (HOSPITAL_COMMUNITY)
Admission: EM | Admit: 2019-03-16 | Discharge: 2019-03-17 | Disposition: A | Discharge status: Other Acute Inpatient Hospital | Payer: Self-pay | Attending: Emergency Medicine | Admitting: Emergency Medicine

## 2019-03-16 ENCOUNTER — Other Ambulatory Visit: Payer: Self-pay

## 2019-03-16 DIAGNOSIS — F329 Major depressive disorder, single episode, unspecified: Secondary | ICD-10-CM | POA: Insufficient documentation

## 2019-03-16 DIAGNOSIS — R45851 Suicidal ideations: Secondary | ICD-10-CM | POA: Insufficient documentation

## 2019-03-16 DIAGNOSIS — F17200 Nicotine dependence, unspecified, uncomplicated: Secondary | ICD-10-CM | POA: Insufficient documentation

## 2019-03-16 DIAGNOSIS — F333 Major depressive disorder, recurrent, severe with psychotic symptoms: Secondary | ICD-10-CM | POA: Diagnosis present

## 2019-03-16 DIAGNOSIS — F419 Anxiety disorder, unspecified: Secondary | ICD-10-CM | POA: Insufficient documentation

## 2019-03-16 DIAGNOSIS — F1594 Other stimulant use, unspecified with stimulant-induced mood disorder: Secondary | ICD-10-CM

## 2019-03-16 DIAGNOSIS — Z046 Encounter for general psychiatric examination, requested by authority: Secondary | ICD-10-CM | POA: Insufficient documentation

## 2019-03-16 MED ORDER — CEPHALEXIN 500 MG PO CAPS
500.0000 mg | ORAL_CAPSULE | Freq: Two times a day (BID) | ORAL | Status: DC
  Administered 2019-03-16 – 2019-03-17 (×2): 500 mg via ORAL
  Filled 2019-03-16 (×2): qty 1

## 2019-03-16 NOTE — ED Notes (Signed)
Bed: [facility] Expected date:  Expected time:  Means of arrival:  Comments: 

## 2019-03-16 NOTE — Consult Note (Addendum)
[facility] Psych ED Discharge  03/16/2019 10:21 AM Jon Peterson  MRN:  [id number] Principal Problem: Amphetamine-induced mood disorder [facility]) Discharge Diagnoses: Principal Problem:   Amphetamine-induced mood disorder (HCC) Active Problems:   MDD (major depressive disorder), recurrent, severe, with psychosis (HCC)   Subjective: Pt was seen and chart reviewed with treatment team and Dr Sharma Covert. Pt denies suicidal/homicidal ideation, denies auditory/visual hallucinations and does not appear to be responding to internal stimuli. Pt came to the [facility] after ingesting methamphetamines, UDS positive for amphetamines, Bal negative. Pt is irritable today and when asked for information as to why he is here, he stated, "didn't someone write it down?" This writer asked Pt to sit up in bed to talk to the treatment team to which he replied, "sitting up is going to help?" pt is not willing to talk to treatment team. Pt was released from [facility] on 3-27 with a plan to go to [facility] on Monday 4/6 for treatment. Pt has multiple legal charges. Pt is not forthcoming with information. Pt will be seen by Peer Support for assistance with substance abuse resources, he will be provided with outpatient resources and shelter resources in case he should need them. Pt is psychiatrically clear.   Total Time spent with patient: 30 minutes  Past Psychiatric History: As above  Past Medical History:  Past Medical History:  Diagnosis Date  . Depression   . Hypercholesterolemia     Past Surgical History:  Procedure Laterality Date  . SURGERY SCROTAL / TESTICULAR     Family History: No family history on file. Family Psychiatric  History: None per chart review.  Social History:  Social History   Substance and Sexual Activity  Alcohol Use Not Currently     Social History   Substance and Sexual Activity  Drug Use Yes  . Types: Amphetamines, Methamphetamines    Social History   Socioeconomic History  . Marital status:  Legally Separated    Spouse name: Not on file  . Number of children: Not on file  . Years of education: Not on file  . Highest education level: Not on file  Occupational History  . Not on file  Social Needs  . Financial resource strain: Not on file  . Food insecurity:    Worry: Not on file    Inability: Not on file  . Transportation needs:    Medical: Not on file    Non-medical: Not on file  Tobacco Use  . Smoking status: Current Every Day Smoker    Packs/day: 1.00  . Smokeless tobacco: Former Engineer, water and Sexual Activity  . Alcohol use: Not Currently  . Drug use: Yes    Types: Amphetamines, Methamphetamines  . Sexual activity: Not Currently  Lifestyle  . Physical activity:    Days per week: Not on file    Minutes per session: Not on file  . Stress: Not on file  Relationships  . Social connections:    Talks on phone: Not on file    Gets together: Not on file    Attends religious service: Not on file    Active member of club or organization: Not on file    Attends meetings of clubs or organizations: Not on file    Relationship status: Not on file  Other Topics Concern  . Not on file  Social History Narrative  . Not on file    Has this patient used any form of tobacco in the last 30 days? (Cigarettes, Smokeless Tobacco, Cigars,  and/or Pipes) Prescription not provided because: Pt declined  Current Medications: Current Facility-Administered Medications  Medication Dose Route Frequency Provider Last Rate Last Dose  . cephALEXin (KEFLEX) capsule 500 mg  500 mg Oral BID Couture, Cortni S, PA-C   500 mg at 03/16/19 2482   Current Outpatient Medications  Medication Sig Dispense Refill  . FLUoxetine (PROZAC) 20 MG capsule Take 1 capsule (20 mg total) by mouth daily. 90 capsule 1     Musculoskeletal: Strength & Muscle Tone: within normal limits Gait & Station: normal Patient leans: N/A  Psychiatric Specialty Exam: Physical Exam  Nursing note and vitals  reviewed. Constitutional: He is oriented to person, place, and time. He appears well-developed and well-nourished.  HENT:  Head: Normocephalic and atraumatic.  Neck: Normal range of motion.  Respiratory: Effort normal.  Musculoskeletal: Normal range of motion.  Neurological: He is alert and oriented to person, place, and time.  Psychiatric: His speech is normal and behavior is normal. Judgment and thought content normal. His mood appears anxious. Cognition and memory are normal.    Review of Systems  Psychiatric/Behavioral: Positive for substance abuse. The patient is nervous/anxious.   All other systems reviewed and are negative.   Blood pressure 115/73, pulse 89, temperature 97.7 F (36.5 C), temperature source Oral, resp. rate 16, SpO2 99 %.There is no height or weight on file to calculate BMI.  General Appearance: Casual  Eye Contact:  Fair  Speech:  Clear and Coherent  Volume:  Decreased  Mood:  Anxious and Irritable  Affect:  Congruent  Thought Process:  Coherent and Descriptions of Associations: Intact  Orientation:  Full (Time, Place, and Person)  Thought Content:  Logical  Suicidal Thoughts:  No, denies this morning  Homicidal Thoughts:  No  Memory:  Immediate;   Good Recent;   Good Remote;   Fair  Judgement:  Fair  Insight:  Fair  Psychomotor Activity:  Normal  Concentration:  Concentration: Good and Attention Span: Good  Recall:  Good  Fund of Knowledge:  Good  Language:  Good  Akathisia:  No  Handed:  Right  AIMS (if indicated):   N/A  Assets:  Architect Housing Social Support  ADL's:  Intact  Cognition:  WNL  Sleep:   N/A     Demographic Factors:  Male, Caucasian, Low socioeconomic status and Unemployed  Loss Factors: Legal issues and Financial problems/change in socioeconomic status  Historical Factors: Impulsivity  Risk Reduction Factors:   Sense of responsibility to family  Continued Clinical  Symptoms:  Alcohol/Substance Abuse/Dependencies  Cognitive Features That Contribute To Risk:  Closed-mindedness    Suicide Risk:  Minimal: No identifiable suicidal ideation.  Patients presenting with no risk factors but with morbid ruminations; may be classified as minimal risk based on the severity of the depressive symptoms  Follow-up Information    Gastroenterology, Deboraha Sprang. Schedule an appointment as soon as possible for a visit in 1 week.   Why:  For recheck Contact information: [address] [place] [address] [phone]        [facility]-EMERGENCY DEPT.   Specialty:  Emergency Medicine Why:  Return to the ER with any new or worsening symptoms Contact information: [address] [id number] [place] [phone]          Plan Of Care/Follow-up recommendations:  Activity:  as tolerated Diet:  Heart healthy  Disposition and Treatment Plan: Amphetamine-induced mood disorder (HCC) Take all medications as  prescribed by your outpatient provider. Keep all follow-up appointments as scheduled.  Do not consume alcohol or use illegal drugs while on prescription medications. Report any adverse effects from your medications to your primary care provider promptly.  In the event of recurrent symptoms or worsening symptoms, call 911, a crisis hotline, or go to the nearest emergency department for evaluation.   Laveda Abbe, NP 03/16/2019, 10:21 AM   Patient seen face-to-face for psychiatric evaluation, chart reviewed and case discussed with the physician extender and developed treatment plan. Reviewed the information documented and agree with the treatment plan.  Juanetta Beets, DO 03/16/19 11:11 AM

## 2019-03-16 NOTE — ED Notes (Signed)
Patient changed into paper scrubs and belongings removed from room.

## 2019-03-16 NOTE — ED Notes (Signed)
Pt DCd off unit to home ambulatory safely with resources given. Instructions to use free bus service given. All belongings returned to pt. Pt in no distress.

## 2019-03-16 NOTE — BH Assessment (Signed)
[facility] Assessment Progress Note  Per Juanetta Beets, DO, this pt does not require psychiatric hospitalization at this time.  Pt is to be discharged from [facility] with referral information for area substance abuse treatment providers, and with information regarding area supportive services for the homeless.  This has been included in pt's discharge instructions.  Pt would also benefit from seeing Peer Support Specialists; they will be asked to speak to pt.  Pt's nurse, Kendal Hymen, has been notified.  Doylene Canning, MA Triage Specialist [phone]

## 2019-03-16 NOTE — ED Notes (Signed)
ED Provider at bedside. 

## 2019-03-16 NOTE — Patient Outreach (Signed)
CPSS met with the patient to provide substance use recovery support and help with substance use treatment resources. 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 Patient said that he had a appointment at [facility] on Monday 03/13/19 for residential substance use treatment, but the patient stated he did not follow up. [facility] and [facility] currently have no residential substance use treatment beds at this time. CPSS provided an residential/outpatient substance use treatment center list, NA meeting list, and CPSS contact information. CPSS informed the patient to follow with [facility] for outpatient treatment due to lack of available residential substance use treatment beds. CPSS strongly encouraged the patient to continuously stay in contact with CPSS for substance use recovery support or for further help with getting connected to substance use treatment resources after discharge from the [facility].
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
## 2019-03-16 NOTE — BH Assessment (Signed)
[facility] Assessment Progress Note  Elta Guadeloupe, NP recommends inpt treatment. TTS to seek placement. Pt states he is willing to sign VOL consent for treatment. EDP Couture, Cortni S, PA-C has been advised. Karleen Hampshire, RN notified of disposition.  Princess Bruins, MSW, LCSW Therapeutic Triage Specialist  [phone]

## 2019-03-16 NOTE — Progress Notes (Signed)
Received Jon Peterson this PM at the change of shift. He is self soothing himself by rocking in bed. He was cooperative with the EKG and blood draw earlier in the PM. Fluids were encouraged and consumed. He was compliant with his medications. He had episodes of jerking motions throughout the evening. He slept throughout the night without incident.

## 2019-03-16 NOTE — ED Triage Notes (Addendum)
Patient c/o SI without plan. D/c this morning. States "nothing has changed." Denies HI/A/V/H.
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
## 2019-03-16 NOTE — ED Notes (Signed)
Patient wanded by security. 
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
## 2019-03-17 ENCOUNTER — Encounter (HOSPITAL_COMMUNITY): Payer: Self-pay | Admitting: Emergency Medicine

## 2019-03-17 ENCOUNTER — Inpatient Hospital Stay (HOSPITAL_COMMUNITY)
Admission: AD | Admit: 2019-03-17 | Discharge: 2019-03-22 | DRG: 885 | Disposition: A | Discharge status: Home / Self Care | Payer: No Typology Code available for payment source | Source: Intra-hospital | Attending: Psychiatry | Admitting: Psychiatry

## 2019-03-17 ENCOUNTER — Other Ambulatory Visit: Payer: Self-pay

## 2019-03-17 DIAGNOSIS — F333 Major depressive disorder, recurrent, severe with psychotic symptoms: Secondary | ICD-10-CM | POA: Diagnosis present

## 2019-03-17 DIAGNOSIS — F15959 Other stimulant use, unspecified with stimulant-induced psychotic disorder, unspecified: Secondary | ICD-10-CM | POA: Diagnosis not present

## 2019-03-17 DIAGNOSIS — F15159 Other stimulant abuse with stimulant-induced psychotic disorder, unspecified: Secondary | ICD-10-CM | POA: Diagnosis present

## 2019-03-17 DIAGNOSIS — E7800 Pure hypercholesterolemia, unspecified: Secondary | ICD-10-CM | POA: Diagnosis present

## 2019-03-17 DIAGNOSIS — F15129 Other stimulant abuse with intoxication, unspecified: Secondary | ICD-10-CM | POA: Diagnosis present

## 2019-03-17 DIAGNOSIS — B1920 Unspecified viral hepatitis C without hepatic coma: Secondary | ICD-10-CM | POA: Diagnosis present

## 2019-03-17 DIAGNOSIS — R45851 Suicidal ideations: Secondary | ICD-10-CM

## 2019-03-17 DIAGNOSIS — G4700 Insomnia, unspecified: Secondary | ICD-10-CM | POA: Diagnosis present

## 2019-03-17 DIAGNOSIS — R253 Fasciculation: Secondary | ICD-10-CM | POA: Diagnosis present

## 2019-03-17 DIAGNOSIS — F332 Major depressive disorder, recurrent severe without psychotic features: Secondary | ICD-10-CM | POA: Diagnosis not present

## 2019-03-17 DIAGNOSIS — F17210 Nicotine dependence, cigarettes, uncomplicated: Secondary | ICD-10-CM | POA: Diagnosis present

## 2019-03-17 LAB — CBC WITH DIFFERENTIAL/PLATELET
Abs Immature Granulocytes: 0.01 10*3/uL (ref 0.00–0.07)
Basophils Absolute: 0 10*3/uL (ref 0.0–0.1)
Basophils Relative: 0 %
Eosinophils Absolute: 0.1 10*3/uL (ref 0.0–0.5)
Eosinophils Relative: 3 %
HCT: 41.6 % (ref 39.0–52.0)
Hemoglobin: 14.5 g/dL (ref 13.0–17.0)
Immature Granulocytes: 0 %
Lymphocytes Relative: 24 %
Lymphs Abs: 1.1 10*3/uL (ref 0.7–4.0)
MCH: 32.8 pg (ref 26.0–34.0)
MCHC: 34.9 g/dL (ref 30.0–36.0)
MCV: 94.1 fL (ref 80.0–100.0)
Monocytes Absolute: 0.7 10*3/uL (ref 0.1–1.0)
Monocytes Relative: 14 %
Neutro Abs: 2.7 10*3/uL (ref 1.7–7.7)
Neutrophils Relative %: 59 %
Platelets: 205 10*3/uL (ref 150–400)
RBC: 4.42 MIL/uL (ref 4.22–5.81)
RDW: 12.3 % (ref 11.5–15.5)
WBC: 4.6 10*3/uL (ref 4.0–10.5)
nRBC: 0 % (ref 0.0–0.2)

## 2019-03-17 LAB — COMPREHENSIVE METABOLIC PANEL
ALT: 225 U/L — ABNORMAL HIGH (ref 0–44)
AST: 138 U/L — ABNORMAL HIGH (ref 15–41)
Albumin: 4.2 g/dL (ref 3.5–5.0)
Alkaline Phosphatase: 39 U/L (ref 38–126)
Anion gap: 6 (ref 5–15)
BUN: 18 mg/dL (ref 6–20)
CO2: 27 mmol/L (ref 22–32)
Calcium: 9 mg/dL (ref 8.9–10.3)
Chloride: 106 mmol/L (ref 98–111)
Creatinine, Ser: 0.76 mg/dL (ref 0.61–1.24)
GFR calc Af Amer: >60 mL/min (ref >60)
GFR calc non Af Amer: >60 mL/min (ref >60)
Glucose, Bld: 97 mg/dL (ref 70–99)
Potassium: 4.1 mmol/L (ref 3.5–5.1)
Sodium: 139 mmol/L (ref 135–145)
Total Bilirubin: 1.2 mg/dL (ref 0.3–1.2)
Total Protein: 7 g/dL (ref 6.5–8.1)

## 2019-03-17 MED ORDER — ACETAMINOPHEN 325 MG PO TABS: 650.0000 mg | ORAL_TABLET | Freq: Four times a day (QID) | ORAL | Status: DC | PRN

## 2019-03-17 MED ORDER — QUETIAPINE FUMARATE 50 MG PO TABS
50.0000 mg | ORAL_TABLET | Freq: Three times a day (TID) | ORAL | Status: DC
  Administered 2019-03-17 – 2019-03-19 (×5): 50 mg via ORAL
  Filled 2019-03-17 (×11): qty 1

## 2019-03-17 MED ORDER — HYDROXYZINE HCL 25 MG PO TABS
25.0000 mg | ORAL_TABLET | Freq: Three times a day (TID) | ORAL | Status: DC | PRN
  Administered 2019-03-17: 25 mg via ORAL
  Filled 2019-03-17: qty 1

## 2019-03-17 MED ORDER — GABAPENTIN 300 MG PO CAPS
300.0000 mg | ORAL_CAPSULE | Freq: Three times a day (TID) | ORAL | Status: DC
  Administered 2019-03-17 – 2019-03-22 (×13): 300 mg via ORAL
  Filled 2019-03-17 (×17): qty 1

## 2019-03-17 MED ORDER — ALUM & MAG HYDROXIDE-SIMETH 200-200-20 MG/5ML PO SUSP: 30.0000 mL | ORAL | Status: DC | PRN

## 2019-03-17 MED ORDER — TEMAZEPAM 15 MG PO CAPS
30.0000 mg | ORAL_CAPSULE | Freq: Every day | ORAL | Status: DC
  Administered 2019-03-17 – 2019-03-21 (×3): 30 mg via ORAL
  Filled 2019-03-17 (×3): qty 2

## 2019-03-17 MED ORDER — FLUOXETINE HCL 20 MG PO CAPS
20.0000 mg | ORAL_CAPSULE | Freq: Every day | ORAL | Status: DC
  Administered 2019-03-17: 20 mg via ORAL
  Filled 2019-03-17: qty 1

## 2019-03-17 MED ORDER — ENSURE ENLIVE PO LIQD
237.0000 mL | Freq: Two times a day (BID) | ORAL | Status: DC
  Administered 2019-03-18 – 2019-03-22 (×7): 237 mL via ORAL

## 2019-03-17 MED ORDER — TRAZODONE HCL 50 MG PO TABS: 50.0000 mg | ORAL_TABLET | Freq: Every evening | ORAL | Status: DC | PRN

## 2019-03-17 MED ORDER — CEPHALEXIN 500 MG PO CAPS
500.0000 mg | ORAL_CAPSULE | Freq: Two times a day (BID) | ORAL | Status: DC
  Administered 2019-03-17 – 2019-03-22 (×10): 500 mg via ORAL
  Filled 2019-03-17 (×13): qty 1

## 2019-03-17 MED ORDER — FLUOXETINE HCL 20 MG PO CAPS
20.0000 mg | ORAL_CAPSULE | Freq: Every day | ORAL | Status: DC
  Filled 2019-03-17 (×2): qty 1

## 2019-03-17 MED ORDER — MAGNESIUM HYDROXIDE 400 MG/5ML PO SUSP: 30.0000 mL | Freq: Every day | ORAL | Status: DC | PRN
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
## 2019-03-17 NOTE — ED Notes (Signed)
Bed: [facility] Expected date:  Expected time:  Means of arrival:  Comments: tr4

## 2019-03-17 NOTE — ED Notes (Signed)
Pt resting.

## 2019-03-17 NOTE — Progress Notes (Addendum)
D: Pt was in dayroom upon initial approach.  Pt presents with anxious affect and mood.  He reports his day was "good."  He forwards little information.  Denies having a goal so Probation officer and pt made goal for pt to be safe.  Pt denies SI/HI, denies hallucinations, reports L arm pain of 3/10.  Pt has been visible in milieu with few peer interactions.    A: Introduced self to pt.  Met with pt 1:1.  Actively listened to pt and offered support and encouragement.  Medication administered per order.  PRN medication administered for anxiety.  PRN pain medication offered, pt declined.  Q15 minute safety checks maintained.  R: Pt is safe on the unit.  Pt is compliant with medications.  Pt verbally contracts for safety.  Will continue to monitor and assess.

## 2019-03-17 NOTE — BHH Suicide Risk Assessment (Signed)
[facility] Admission Suicide Risk Assessment    Total Time spent with patient: 45 minutes Principal Problem: Methamphetamine induced psychosis Diagnosis:  Active Problems:   MDD (major depressive disorder), recurrent severe, without psychosis (HCC)   Methamphetamine-induced psychotic disorder (HCC)  Subjective Data: Requires readmission and stabilization  Continued Clinical Symptoms:    The "Alcohol Use Disorders Identification Test", Guidelines for Use in Primary Care, Second Edition.  World Science writer [facility]). Score between 0-7:  no or low risk or alcohol related problems. Score between 8-15:  moderate risk of alcohol related problems. Score between 16-19:  high risk of alcohol related problems. Score 20 or above:  warrants further diagnostic evaluation for alcohol dependence and treatment.   CLINICAL FACTORS:   Alcohol/Substance Abuse/Dependencies   Musculoskeletal: Strength & Muscle Tone: spastic Gait & Station: normal  COGNITIVE FEATURES THAT CONTRIBUTE TO RISK:  Loss of executive function    SUICIDE RISK:   Mild:  Suicidal ideation of limited frequency, intensity, duration, and specificity.  There are no identifiable plans, no associated intent, mild dysphoria and related symptoms, good self-control (both objective and subjective assessment), few other risk factors, and identifiable protective factors, including available and accessible social support.  PLAN OF CARE: Current inpatient stabilization for acute dangerousness  I certify that inpatient services furnished can reasonably be expected to improve the patient's condition.   Malvin Johns, MD 03/17/2019, 2:44 PM
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
## 2019-03-17 NOTE — BH Assessment (Signed)
[facility] Assessment Progress Note  Per Juanetta Beets, DO, this pt requires psychiatric hospitalization at this time.  Berneice Heinrich, RN, [facility] has assigned pt to [facility] Rm 302-1.  Pt has signed Voluntary Admission and Consent for Treatment, as well as Consent to Release Information to no one, and signed forms have been faxed to [facility].  Pt's nurse, Morrie Sheldon, has been notified, and agrees to send original paperwork along with pt via Juel Burrow, and to call report to [phone].  Doylene Canning, [place] Behavioral Health Coordinator [phone]

## 2019-03-17 NOTE — ED Notes (Signed)
[facility] transport to ED to transfer pt to [facility] Unit per MD order. Pt signed e-signature. Pt ambulatory off unit with MHT to main ED entrance to [employer]. Personal property given to transport for transfer.

## 2019-03-17 NOTE — BHH Group Notes (Signed)
BHH Group Notes:  (Nursing/MHT/Case Management/Adjunct)  Date:  03/17/2019  Time:  4:00 PM  Type of Therapy:  Music Therapy  Participation Level:  Active  Participation Quality:  Appropriate, Sharing and Supportive  Affect:  Appropriate  Cognitive:  Alert and Appropriate  Insight:  Appropriate and Improving  Engagement in Group:  Developing/Improving and Engaged  Modes of Intervention:  Discussion and Socialization  Summary of Progress/Problems: Patients were invited to share a song with the group that is meaningful to them. Patients were asked why they chose the song and what memories it brought back. Patients also discussed team building.   Jon Peterson 03/17/2019, 5:38 PM 

## 2019-03-17 NOTE — BHH Group Notes (Signed)
Adult Psychoeducational Group Note  Date:  03/17/2019 Time:  8:36 PM  Group Topic/Focus:  Wrap-Up Group:   The focus of this group is to help patients review their daily goal of treatment and discuss progress on daily workbooks.  Participation Level:  Minimal  Participation Quality:  Appropriate  Affect:  Appropriate  Cognitive:  Appropriate  Insight: Lacking  Engagement in Group:  Developing/Improving  Modes of Intervention:  Discussion and Education  Additional Comments:  Pt attended and participated in wrap up group this evening. Pt rated their day a 3/10. Pt completed their goal, which was to be safe and to sleep well. Pt has no questions or concerns at the moment.   Chrisandra Netters 03/17/2019, 8:36 PM
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
## 2019-03-17 NOTE — Tx Team (Signed)
Initial Treatment Plan 03/17/2019 3:10 PM [name] VAN:[id number]    PATIENT STRESSORS: Legal issue Substance abuse   PATIENT STRENGTHS: Capable of independent living Motivation for treatment/growth   PATIENT IDENTIFIED PROBLEMS: "Life"  "Substance Abuse"  "Legal Issues"                 DISCHARGE CRITERIA:  Improved stabilization in mood, thinking, and/or behavior Motivation to continue treatment in a less acute level of care Reduction of life-threatening or endangering symptoms to within safe limits Verbal commitment to aftercare and medication compliance Withdrawal symptoms are absent or subacute and managed without 24-hour nursing intervention  PRELIMINARY DISCHARGE PLAN: Outpatient therapy Return to previous living arrangement  PATIENT/FAMILY INVOLVEMENT: This treatment plan has been presented to and reviewed with the patient, Jon Peterson, and/or family member.  The patient and family have been given the opportunity to ask questions and make suggestions.  Tania Ade, RN 03/17/2019, 3:10 PM

## 2019-03-17 NOTE — ED Notes (Signed)
Report called to RN [place], [facility], [address]

## 2019-03-17 NOTE — Progress Notes (Signed)
Adult Psychoeducational Group Note  Date:  03/17/2019 Time:  9:05 PM  Group Topic/Focus:  Wrap-Up Group:   The focus of this group is to help patients review their daily goal of treatment and discuss progress on daily workbooks.  Participation Level:  Active  Participation Quality:  Appropriate  Affect:  Blunted  Cognitive:  Oriented  Insight: Appropriate  Engagement in Group:  Engaged  Modes of Intervention:  Discussion and Socialization  Additional Comments:  Patient attended and participated in group tonight. He reports that today he went outside with the group and played basketball. He went to his group. He is concern about tonight due to medication changes. Patient reports that the doctor did not discussed the medication change with him. He believe the doctor is using medication to punish him.  Lita Mains [facility] 03/17/2019, 9:05 PM

## 2019-03-17 NOTE — ED Notes (Signed)
Pt A&O x 3, no distress noted, calm & cooperative. Presents with SI, no plan, no past history of SI attempts.  Denies HI, AVH.  Feeling hopeless.  Admits to Meth use x 3 days ago.  Monitoring for safety, Q 15 min checks in effect.

## 2019-03-18 MED ORDER — NICOTINE 21 MG/24HR TD PT24
21.0000 mg | MEDICATED_PATCH | Freq: Every day | TRANSDERMAL | Status: DC
  Administered 2019-03-18 – 2019-03-19 (×2): 21 mg via TRANSDERMAL
  Filled 2019-03-18 (×6): qty 1

## 2019-03-18 NOTE — Progress Notes (Signed)
Did not attend group 

## 2019-03-18 NOTE — Progress Notes (Signed)
7a-7p Shift:  D:  Pt had slept for much of the morning, refusing to get up for medications or group.  Once awake, he did take his medications, but was guarded and fidgety.  He agreed to allow his parole officer to come to [facility] to either charge or remove his ankle monitor bracelet but did not want to discuss his legal issues at this time.  He denies any side effects or somatic complaints at this time but does endorse passive SI while also contracting for safety.  A:  Support, education, and encouragement provided as appropriate to situation.  Medications administered per MD order.  Level 3 checks continued for safety.   R:  Pt receptive to measures; Safety maintained.

## 2019-03-18 NOTE — BHH Group Notes (Signed)
LCSW Group Therapy Note  03/18/2019    10:00-11:00am   Type of Therapy and Topic:  Group Therapy: Early Messages Received About Anger  Participation Level:  Did Not Attend   Description of Group:   In this group, patients shared and discussed the early messages received in their lives about anger through parental or other adult modeling, teaching, repression, punishment, violence, and more.  Participants identified how those childhood lessons influence even now how they usually or often react when angered.  The group discussed that anger is a secondary emotion and what may be the underlying emotional themes that come out through anger outbursts or that are ignored through anger suppression.  Finally, as a group there was a conversation about the workbook's quote that "There is nothing wrong with anger; it is just a sign something needs to change."     Therapeutic Goals: 1. Patients will identify one or more childhood message about anger that they received and how it was taught to them. 2. Patients will discuss how these childhood experiences have influenced and continue to influence their own expression or repression of anger even today. 3. Patients will explore possible primary emotions that tend to fuel their secondary emotion of anger. 4. Patients will learn that anger itself is normal and cannot be eliminated, and that healthier coping skills can assist with resolving conflict rather than worsening situations.  Summary of Patient Progress:   Did not attend Therapeutic Modalities:   Cognitive Behavioral Therapy Motivation Interviewing  Henrene Dodge, LCSW .

## 2019-03-18 NOTE — Progress Notes (Addendum)
[facility]  03/18/2019 10:40 AM Jon Peterson  MRN:  [id number]   Subjective: Patient reports today that he is feeling a little bit better.  Patient denies any suicidal or homicidal ideations and denies any hallucinations.  Patient denies any withdrawal symptoms.  Patient denies any medication side effects.  Patient reports that he did sleep well and has had a good appetite since he has been here.  Patient states there are no concerns or needs at this time.  Patient states he just wants to go back to sleep.  Objective: Patient's chart and findings reviewed and discussed with treatment team.  Patient presents in his room lying in his bed and is asleep, but is easily aroused.  Patient does not make very much eye contact at least lays in the bed with his eyes closed.  He is communicative and is cooperative at this time.  Feel that the patient is still having some lingering effects due to his methamphetamine abuse and restarting his medications.  Principal Problem: MDD (major depressive disorder), recurrent severe, without psychosis (HCC) Diagnosis: Principal Problem:   MDD (major depressive disorder), recurrent severe, without psychosis (HCC) Active Problems:   Methamphetamine-induced psychotic disorder (HCC)  Total Time spent with patient: 20 minutes  Past Psychiatric History: See H&P  Past Medical History:  Past Medical History:  Diagnosis Date  . Depression   . Hypercholesterolemia     Past Surgical History:  Procedure Laterality Date  . SURGERY SCROTAL / TESTICULAR     Family History: History reviewed. No pertinent family history. Family Psychiatric  History: See H&P Social History:  Social History   Substance and Sexual Activity  Alcohol Use Not Currently     Social History   Substance and Sexual Activity  Drug Use Yes  . Types: Amphetamines, Methamphetamines    Social History   Socioeconomic History  . Marital status: Legally Separated    Spouse name:  Not on file  . Number of children: Not on file  . Years of education: Not on file  . Highest education level: Not on file  Occupational History  . Not on file  Social Needs  . Financial resource strain: Not on file  . Food insecurity:    Worry: Not on file    Inability: Not on file  . Transportation needs:    Medical: Not on file    Non-medical: Not on file  Tobacco Use  . Smoking status: Current Every Day Smoker    Packs/day: 1.00  . Smokeless tobacco: Former Engineer, waterUser  Substance and Sexual Activity  . Alcohol use: Not Currently  . Drug use: Yes    Types: Amphetamines, Methamphetamines  . Sexual activity: Not Currently  Lifestyle  . Physical activity:    Days per week: Not on file    Minutes per session: Not on file  . Stress: Not on file  Relationships  . Social connections:    Talks on phone: Not on file    Gets together: Not on file    Attends religious service: Not on file    Active member of club or organization: Not on file    Attends meetings of clubs or organizations: Not on file    Relationship status: Not on file  Other Topics Concern  . Not on file  Social History Narrative  . Not on file   Additional Social History:  Sleep: Good  Appetite:  Good  Current Medications: Current Facility-Administered Medications  Medication Dose Route Frequency Provider Last Rate Last Dose  . acetaminophen (TYLENOL) tablet 650 mg  650 mg Oral Q6H PRN Laveda Abbe, NP      . alum & mag hydroxide-simeth (MAALOX/MYLANTA) 200-200-20 MG/5ML suspension 30 mL  30 mL Oral Q4H PRN Laveda Abbe, NP      . cephALEXin [facility]) capsule 500 mg  500 mg Oral BID Laveda Abbe, NP   500 mg at 03/17/19 1656  . feeding supplement (ENSURE ENLIVE) (ENSURE ENLIVE) liquid 237 mL  237 mL Oral BID BM Corbett Moulder A, MD      . gabapentin (NEURONTIN) capsule 300 mg  300 mg Oral TID Malvin Johns, MD   300 mg at 03/17/19 1656  . hydrOXYzine  (ATARAX/VISTARIL) tablet 25 mg  25 mg Oral TID PRN Laveda Abbe, NP   25 mg at 03/17/19 2104  . magnesium hydroxide (MILK OF MAGNESIA) suspension 30 mL  30 mL Oral Daily PRN Laveda Abbe, NP      . nicotine (NICODERM CQ - dosed in mg/24 hours) patch 21 mg  21 mg Transdermal Daily Atzin Buchta A, MD      . QUEtiapine (SEROQUEL) tablet 50 mg  50 mg Oral TID Malvin Johns, MD   50 mg at 03/17/19 1656  . temazepam (RESTORIL) capsule 30 mg  30 mg Oral QHS Malvin Johns, MD   30 mg at 03/17/19 2105  . traZODone (DESYREL) tablet 50 mg  50 mg Oral QHS PRN Laveda Abbe, NP        Lab Results:  Results for orders placed or performed during the hospital encounter of 03/16/19 (from the past 48 hour(s))  CBC with Differential/Platelet     Status: None   Collection Time: 03/17/19  9:28 AM  Result Value Ref Range   WBC 4.6 4.0 - 10.5 K/uL   RBC 4.42 4.22 - 5.81 MIL/uL   Hemoglobin 14.5 13.0 - 17.0 g/dL   HCT 40.9 81.1 - 91.4 %   MCV 94.1 80.0 - 100.0 fL   MCH 32.8 26.0 - 34.0 pg   MCHC 34.9 30.0 - 36.0 g/dL   RDW 78.2 95.6 - 21.3 %   Platelets 205 150 - 400 K/uL   nRBC 0.0 0.0 - 0.2 %   Neutrophils Relative % 59 %   Neutro Abs 2.7 1.7 - 7.7 K/uL   Lymphocytes Relative 24 %   Lymphs Abs 1.1 0.7 - 4.0 K/uL   Monocytes Relative 14 %   Monocytes Absolute 0.7 0.1 - 1.0 K/uL   Eosinophils Relative 3 %   Eosinophils Absolute 0.1 0.0 - 0.5 K/uL   Basophils Relative 0 %   Basophils Absolute 0.0 0.0 - 0.1 K/uL   Immature Granulocytes 0 %   Abs Immature Granulocytes 0.01 0.00 - 0.07 K/uL    Comment: Performed at [facility], [address] [place], [place] [address]  Comprehensive metabolic panel     Status: Abnormal   Collection Time: 03/17/19  9:28 AM  Result Value Ref Range   Sodium 139 135 - 145 mmol/L   Potassium 4.1 3.5 - 5.1 mmol/L    Comment: DELTA CHECK NOTED REPEATED TO VERIFY NO VISIBLE HEMOLYSIS    Chloride 106 98 - 111 mmol/L   CO2 27  22 - 32 mmol/L   Glucose, Bld 97 70 - 99 mg/dL   BUN 18 6 - 20  mg/dL   Creatinine, Ser 6.96 0.61 - 1.24 mg/dL   Calcium 9.0 8.9 - 29.5 mg/dL   Total Protein 7.0 6.5 - 8.1 g/dL   Albumin 4.2 3.5 - 5.0 g/dL   AST 284 (H) 15 - 41 U/L   ALT 225 (H) 0 - 44 U/L   Alkaline Phosphatase 39 38 - 126 U/L   Total Bilirubin 1.2 0.3 - 1.2 mg/dL   GFR calc non Af Amer >60 >60 mL/min   GFR calc Af Amer >60 >60 mL/min   Anion gap 6 5 - 15    Comment: Performed at [facility], [address], [place], [place] [address]    Blood Alcohol level:  Lab Results  Component Value Date   ETH <10 03/15/2019   ETH <10 03/07/2019    Metabolic Disorder Labs: No results found for: HGBA1C, MPG No results found for: PROLACTIN No results found for: CHOL, TRIG, HDL, CHOLHDL, VLDL, LDLCALC  Physical Findings: AIMS: Facial and Oral Movements Muscles of Facial Expression: None, normal Lips and Perioral Area: None, normal Jaw: None, normal Tongue: None, normal,Extremity Movements Upper (arms, wrists, hands, fingers): None, normal Lower (legs, knees, ankles, toes): None, normal, Trunk Movements Neck, shoulders, hips: None, normal, Overall Severity Severity of abnormal movements (highest score from questions above): None, normal Incapacitation due to abnormal movements: None, normal Patient's awareness of abnormal movements (rate only patient's report): No Awareness, Dental Status Current problems with teeth and/or dentures?: No Does patient usually wear dentures?: No  CIWA:  CIWA-Ar Total: 3 COWS:  COWS Total Score: 6  Musculoskeletal: Strength & Muscle Tone: within normal limits Gait & Station: normal Patient leans: N/A  Psychiatric Specialty Exam: Physical Exam  Nursing note and vitals reviewed. Constitutional: He is oriented to person, place, and time. He appears well-developed and well-nourished.  Cardiovascular: Normal rate.  Respiratory: Effort normal.  Musculoskeletal:  Normal range of motion.  Neurological: He is alert and oriented to person, place, and time.  Skin: Skin is warm.    Review of Systems  Constitutional: Negative.   HENT: Negative.   Eyes: Negative.   Respiratory: Negative.   Cardiovascular: Negative.   Gastrointestinal: Negative.   Genitourinary: Negative.   Musculoskeletal: Negative.   Skin: Negative.   Neurological: Negative.   Endo/Heme/Allergies: Negative.   Psychiatric/Behavioral: Positive for depression and substance abuse. Negative for hallucinations and suicidal ideas.    Blood pressure 105/60, pulse 85, temperature 98 F (36.7 C), temperature source Oral, height 5\' 8"  (1.727 m), weight 83.9 kg, SpO2 100 %.Body mass index is 28.13 kg/m.  General Appearance: Casual  Eye Contact:  Minimal  Speech:  Clear and Coherent and Normal Rate  Volume:  Normal  Mood:  Depressed  Affect:  Flat  Thought Process:  Coherent and Descriptions of Associations: Intact  Orientation:  Full (Time, Place, and Person)  Thought Content:  WDL  Suicidal Thoughts:  No  Homicidal Thoughts:  No  Memory:  Immediate;   Good Recent;   Good Remote;   Good  Judgement:  Fair  Insight:  Fair  Psychomotor Activity:  Normal  Concentration:  Concentration: Good and Attention Span: Good  Recall:  Good  Fund of Knowledge:  Good  Language:  Good  Akathisia:  No  Handed:  Right  AIMS (if indicated):     Assets:  Communication Skills Desire for Improvement Resilience  ADL's:  Intact  Cognition:  WNL  Sleep:  Number of Hours: 6.5   Problems Addressed: MDD severe  recurrent Methamphetamine-induced mood disorder  Treatment Plan Summary: Daily contact with patient to assess and evaluate symptoms and progress in treatment, Medication management and Plan is to: Continue Neurontin 300 mg p.o. 3 times daily for withdrawal symptoms Continue Vistaril 25 mg p.o. 3 times daily PRN for anxiety Continue Seroquel 50 mg p.o. 3 times daily for mood  stability Continue Restoril 30 mg p.o. nightly for insomnia Continue trazodone 50 mg p.o. nightly as needed for insomnia Encourage group therapy participation Social work to assist with disposition planning for discharge  Maryfrances Bunnell, FNP 03/18/2019, 10:40 AM   Attest to NP Progress Note

## 2019-03-19 MED ORDER — QUETIAPINE FUMARATE 25 MG PO TABS: 25.0000 mg | ORAL_TABLET | Freq: Three times a day (TID) | ORAL | Status: DC | PRN

## 2019-03-19 MED ORDER — VENLAFAXINE HCL ER 37.5 MG PO CP24
37.5000 mg | ORAL_CAPSULE | Freq: Every day | ORAL | Status: DC
  Administered 2019-03-19 – 2019-03-22 (×4): 37.5 mg via ORAL
  Filled 2019-03-19 (×6): qty 1

## 2019-03-19 NOTE — Progress Notes (Signed)
D: Patient alert and oriented. Affect/mood: Flat in affect, depressed in mood. Denies HI, AVH at this time. Patient does however endorse increased feelings of depression with passive suicidal thoughts at times. Denies pain. Patient verbalizes understanding of medication education provided, and has been requesting and drinking ensures in between meals. Patient denies any further issues with appetite, though needed great encouragement to wake up and get out of bed this morning.   A: Scheduled medications administered to patient per MD order. Support and encouragement provided. Routine safety checks conducted every 15 minutes. Patient informed to notify staff with problems or concerns.  R: No adverse drug reactions noted. Patient contracts for safety at this time. Will continue to monitor.

## 2019-03-19 NOTE — Progress Notes (Addendum)
[facility] MD Progress Note  03/19/2019 1:17 PM Jon Peterson  MRN:  [id number]   Subjective: Patient reports today that he is still feeling very depressed and anxious depression only 9 out of 10 with 10 being the worst.  He also states that the medications is making him feel very sleepy all day long.  He states his anxiety is mild and not very significant.  He denies any suicidal or homicidal ideations and denies any hallucinations.  He reports in the past that he is been prescribed Prozac and Celexa with no benefit from either.  He denies having any follow-up after his last admission to the hospital.  He states that he would like a medication that can improve his energy level.  He does report that his appetite has improved some since he has been here though.  Objective: Patient's chart and findings reviewed and discussed with treatment team.  Patient presents in the day room and is attending group.  He is pleasant, calm, and cooperative, but appears to be groggy due to the medications he is being prescribed.  Patient has been compliant with treatment thus far and there have been no complaints about the patient on the unit.  Principal Problem: MDD (major depressive disorder), recurrent severe, without psychosis (HCC) Diagnosis: Principal Problem:   MDD (major depressive disorder), recurrent severe, without psychosis (HCC) Active Problems:   Methamphetamine-induced psychotic disorder (HCC)  Total Time spent with patient: 30 minutes  Past Psychiatric History: See H&P  Past Medical History:  Past Medical History:  Diagnosis Date  . Depression   . Hypercholesterolemia     Past Surgical History:  Procedure Laterality Date  . SURGERY SCROTAL / TESTICULAR     Family History: History reviewed. No pertinent family history. Family Psychiatric  History: See H&P Social History:  Social History   Substance and Sexual Activity  Alcohol Use Not Currently     Social History   Substance and  Sexual Activity  Drug Use Yes  . Types: Amphetamines, Methamphetamines    Social History   Socioeconomic History  . Marital status: Legally Separated    Spouse name: Not on file  . Number of children: Not on file  . Years of education: Not on file  . Highest education level: Not on file  Occupational History  . Not on file  Social Needs  . Financial resource strain: Not on file  . Food insecurity:    Worry: Not on file    Inability: Not on file  . Transportation needs:    Medical: Not on file    Non-medical: Not on file  Tobacco Use  . Smoking status: Current Every Day Smoker    Packs/day: 1.00  . Smokeless tobacco: Former Engineer, water and Sexual Activity  . Alcohol use: Not Currently  . Drug use: Yes    Types: Amphetamines, Methamphetamines  . Sexual activity: Not Currently  Lifestyle  . Physical activity:    Days per week: Not on file    Minutes per session: Not on file  . Stress: Not on file  Relationships  . Social connections:    Talks on phone: Not on file    Gets together: Not on file    Attends religious service: Not on file    Active member of club or organization: Not on file    Attends meetings of clubs or organizations: Not on file    Relationship status: Not on file  Other Topics Concern  . Not on file  Social History Narrative  . Not on file   Additional Social History:                         Sleep: Good  Appetite:  Good  Current Medications: Current Facility-Administered Medications  Medication Dose Route Frequency Provider Last Rate Last Dose  . acetaminophen (TYLENOL) tablet 650 mg  650 mg Oral Q6H PRN Laveda Abbe, NP      . alum & mag hydroxide-simeth (MAALOX/MYLANTA) 200-200-20 MG/5ML suspension 30 mL  30 mL Oral Q4H PRN Laveda Abbe, NP      . cephALEXin [facility]) capsule 500 mg  500 mg Oral BID Laveda Abbe, NP   500 mg at 03/19/19 0817  . feeding supplement (ENSURE ENLIVE) (ENSURE ENLIVE)  liquid 237 mL  237 mL Oral BID BM Cobos, Rockey Situ, MD   237 mL at 03/19/19 1156  . gabapentin (NEURONTIN) capsule 300 mg  300 mg Oral TID Malvin Johns, MD   300 mg at 03/19/19 1155  . hydrOXYzine (ATARAX/VISTARIL) tablet 25 mg  25 mg Oral TID PRN Laveda Abbe, NP   25 mg at 03/17/19 2104  . magnesium hydroxide (MILK OF MAGNESIA) suspension 30 mL  30 mL Oral Daily PRN Laveda Abbe, NP      . nicotine (NICODERM CQ - dosed in mg/24 hours) patch 21 mg  21 mg Transdermal Daily Cobos, Rockey Situ, MD   21 mg at 03/19/19 0819  . QUEtiapine (SEROQUEL) tablet 25 mg  25 mg Oral TID PRN Money, Gerlene Burdock, FNP      . temazepam (RESTORIL) capsule 30 mg  30 mg Oral QHS Malvin Johns, MD   30 mg at 03/17/19 2105  . traZODone (DESYREL) tablet 50 mg  50 mg Oral QHS PRN Laveda Abbe, NP      . venlafaxine XR [facility]) 24 hr capsule 37.5 mg  37.5 mg Oral Daily Money, Gerlene Burdock, FNP   37.5 mg at 03/19/19 1257    Lab Results:  No results found for this or any previous visit (from the past 48 hour(s)).  Blood Alcohol level:  Lab Results  Component Value Date   ETH <10 03/15/2019   ETH <10 03/07/2019    Metabolic Disorder Labs: No results found for: HGBA1C, MPG No results found for: PROLACTIN No results found for: CHOL, TRIG, HDL, CHOLHDL, VLDL, LDLCALC  Physical Findings: AIMS: Facial and Oral Movements Muscles of Facial Expression: None, normal Lips and Perioral Area: None, normal Jaw: None, normal Tongue: None, normal,Extremity Movements Upper (arms, wrists, hands, fingers): None, normal Lower (legs, knees, ankles, toes): None, normal, Trunk Movements Neck, shoulders, hips: None, normal, Overall Severity Severity of abnormal movements (highest score from questions above): None, normal Incapacitation due to abnormal movements: None, normal Patient's awareness of abnormal movements (rate only patient's report): No Awareness, Dental Status Current problems with teeth and/or  dentures?: No Does patient usually wear dentures?: No  CIWA:  CIWA-Ar Total: 3 COWS:  COWS Total Score: 6  Musculoskeletal: Strength & Muscle Tone: within normal limits Gait & Station: normal Patient leans: N/A  Psychiatric Specialty Exam: Physical Exam  Nursing note and vitals reviewed. Constitutional: He is oriented to person, place, and time. He appears well-developed and well-nourished.  Cardiovascular: Normal rate.  Respiratory: Effort normal.  Musculoskeletal: Normal range of motion.  Neurological: He is alert and oriented to person, place, and time.  Skin: Skin is warm.    Review of  Systems  Constitutional: Negative.   HENT: Negative.   Eyes: Negative.   Respiratory: Negative.   Cardiovascular: Negative.   Gastrointestinal: Negative.   Genitourinary: Negative.   Musculoskeletal: Negative.   Skin: Negative.   Neurological: Negative.   Endo/Heme/Allergies: Negative.   Psychiatric/Behavioral: Positive for depression and substance abuse. Negative for hallucinations and suicidal ideas.    Blood pressure 115/78, pulse 74, temperature 97.6 F (36.4 C), temperature source Oral, height 5\' 8"  (1.727 m), weight 83.9 kg, SpO2 100 %.Body mass index is 28.13 kg/m.  General Appearance: Casual  Eye Contact:  Minimal  Speech:  Clear and Coherent and Normal Rate  Volume:  Normal  Mood:  Depressed  Affect:  Flat  Thought Process:  Coherent and Descriptions of Associations: Intact  Orientation:  Full (Time, Place, and Person)  Thought Content:  WDL  Suicidal Thoughts:  No  Homicidal Thoughts:  No  Memory:  Immediate;   Good Recent;   Good Remote;   Good  Judgement:  Fair  Insight:  Fair  Psychomotor Activity:  Normal  Concentration:  Concentration: Good and Attention Span: Good  Recall:  Good  Fund of Knowledge:  Good  Language:  Good  Akathisia:  No  Handed:  Right  AIMS (if indicated):     Assets:  Communication Skills Desire for Improvement Resilience  ADL's:   Intact  Cognition:  WNL  Sleep:  Number of Hours: 6.75   Problems Addressed: MDD severe recurrent Methamphetamine-induced mood disorder  Treatment Plan Summary: Daily contact with patient to assess and evaluate symptoms and progress in treatment, Medication management and Plan is to: Continue Neurontin 300 mg p.o. 3 times daily for withdrawal symptoms Continue Vistaril 25 mg p.o. 3 times daily PRN for anxiety Decrease Seroquel 25 mg p.o. 3 times daily PRN for agitation Start Effexor-XR 37.5 mg PO Daily Continue Restoril 30 mg p.o. nightly for insomnia Continue trazodone 50 mg p.o. nightly as needed for insomnia Encourage group therapy participation Social work to assist with disposition planning for discharge  Maryfrances Bunnell, FNP 03/19/2019, 1:17 PM   Attest to NP Progress Note
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
## 2019-03-19 NOTE — B[facility] Suicide Risk Assessment (Signed)
[facility] INPATIENT:  Family/Significant Other Suicide Prevention Education  Suicide Prevention Education:  Patient Refusal for Family/Significant Other Suicide Prevention Education: The patient Jon Peterson has refused to provide written consent for family/significant other to be provided Family/Significant Other Suicide Prevention Education during admission and/or prior to discharge.  Physician notified.  SPE completed with pt, as pt refused to consent to family contact. SPI pamphlet provided to pt and pt was encouraged to share information with support network, ask questions, and talk about any concerns relating to SPE. Pt denies access to guns/firearms and verbalized understanding of information provided. Mobile Crisis information also provided to pt.   Rona Ravens LCSW 03/19/2019, 1:05 PM

## 2019-03-19 NOTE — Progress Notes (Signed)
[facility] Group Notes:  (Nursing/MHT/Case Management/Adjunct)  Date:  03/19/2019  Time: 2030 Type of Therapy:  wrap up group  Participation Level:  Minimal  Participation Quality:  Attentive and Supportive  Affect:  Depressed and Flat  Cognitive:  Appropriate  Insight:  Limited  Engagement in Group:  Supportive  Modes of Intervention:  Clarification, Education and Support  Summary of Progress/Problems: If pt could change any one thing about his life he would change ever being born. Pt asked if currently suicidal and pt reported no. Pt shared that he did not have anything he felt grateful for in his life currently.   Jon Peterson 03/19/2019, 9:50 PM

## 2019-03-19 NOTE — Progress Notes (Signed)
NUTRITION ASSESSMENT  Pt identified as at risk on the Malnutrition Screen Tool  INTERVENTION: 1. Supplements: Continue Ensure Enlive po BID, each supplement provides 350 kcal and 20 grams of protein  NUTRITION DIAGNOSIS: Unintentional weight loss related to sub-optimal intake as evidenced by pt report.   Goal: Pt to meet >/= 90% of their estimated nutrition needs.  Monitor:  PO intake  Assessment:  Pt admitted with depression and substance abuse (methamphetamine). Pt reported poor appetite and weight loss per MST screen. Pt now reports appetite has improved since admission. Pt has been ordered Ensure supplements.   Height: Ht Readings from Last 1 Encounters:  03/17/19 5\' 8"  (1.727 m)    Weight: Wt Readings from Last 1 Encounters:  03/17/19 83.9 kg    Weight Hx: Wt Readings from Last 10 Encounters:  03/17/19 83.9 kg  03/08/19 84.4 kg  03/07/19 83.9 kg    BMI:  Body mass index is 28.13 kg/m. Pt meets criteria for overweight based on current BMI.  Estimated Nutritional Needs: Kcal: 25-30 kcal/kg Protein: > 1 gram protein/kg Fluid: 1 ml/kcal  Diet Order:  Diet Order            Diet regular Room service appropriate? No; Fluid consistency: Thin  Diet effective now             Pt is also offered choice of unit snacks mid-morning and mid-afternoon.  Pt is eating as desired.   Lab results and medications reviewed.   Tilda Franco, MS, RD, LDN [facility] Inpatient Clinical Dietitian Pager: [phone] After Hours Pager: [phone]

## 2019-03-19 NOTE — B[facility] Group Notes (Signed)
[facility] LCSW Group Therapy Note  Date/Time:  03/19/2019 9:00-10:00 or 10:00-11:00AM  Type of Therapy and Topic:  Group Therapy:  Healthy and Unhealthy Supports  Participation Level:  None   Description of Group:  Patients in this group were introduced to the idea of adding a variety of healthy supports to address the various needs in their lives.Patients discussed what additional healthy supports could be helpful in their recovery and wellness after discharge in order to prevent future hospitalizations.   An emphasis was placed on using counselor, doctor, therapy groups, 12-step groups, and problem-specific support groups to expand supports.  They also worked as a group on developing a specific plan for several patients to deal with unhealthy supports through boundary-setting, psychoeducation with loved ones, and even termination of relationships.   Therapeutic Goals:   1)  discuss importance of adding supports to stay well once out of the hospital  2)  compare healthy versus unhealthy supports and identify some examples of each  3)  generate ideas and descriptions of healthy supports that can be added  4)  offer mutual support about how to address unhealthy supports  5)  encourage active participation in and adherence to discharge plan    Summary of Patient Progress:  The patient  mostly slept through group.  Therapeutic Modalities:   Motivational Interviewing Brief Solution-Focused Therapy  Evorn Gong

## 2019-03-19 NOTE — Progress Notes (Signed)
D: Pt was in dayroom upon initial approach.  Pt presents with depressed affect and mood.  He describes his day as "all right" and made goal to "be safe."  He reports he has been "sleeping a lot" we discussed the medication change that was made today.  Pt denies SI/HI, denies hallucinations, denies pain.  Pt has been visible in milieu interacting with peers and staff appropriately.     A: Introduced self to pt.  Met with pt 1:1.  Actively listened to pt and offered support and encouragement.  Medication administered per order.  Q15 minute safety checks maintained.  R: Pt is safe on the unit.  Pt is compliant with medication.  Pt verbally contracts for safety.  Will continue to monitor and assess.

## 2019-03-20 MED ORDER — ILOPERIDONE 2 MG PO TABS
4.0000 mg | ORAL_TABLET | Freq: Three times a day (TID) | ORAL | Status: DC
  Administered 2019-03-20 – 2019-03-22 (×5): 4 mg via ORAL
  Filled 2019-03-20 (×9): qty 1

## 2019-03-20 NOTE — Progress Notes (Signed)
CSW met with patient to discuss discharge planning. Patient is interested in residential treatment; however he has court dates for assault and larceny which are barriers to treatment referrals in addition to residential facilities limiting their admissions due to [place] 19 pandemic.  Patient's probation officer inquired about [facility] in [place]. CSW called [facility] intake who confirms there is a 4-6 week waitlist. Patient informed. Patient will likely discharge home, he states he would like to follow up with [facility] for outpatient.  Stephanie Acre, LCSW-A Clinical Social Worker

## 2019-03-20 NOTE — Plan of Care (Signed)
Patient was anxious and minimal throughout the assessment. Denies SI HI AVH. Patient was asked if he was ready to leave, patient said "I don't know." Patient denies physical problems and physical pain. Patient is compliant with medications, no side effects noted. Safety is maintained with 15 minute checks as well as environmental checks. Will continue to monitor and assess.  Problem: Education: Goal: Mental status will improve Outcome: Progressing Goal: Verbalization of understanding the information provided will improve Outcome: Progressing   Problem: Activity: Goal: Interest or engagement in activities will improve Outcome: Progressing Goal: Sleeping patterns will improve Outcome: Progressing

## 2019-03-20 NOTE — Progress Notes (Signed)
The patient's positive event for the day was that he went outside for fresh air. Nothing else about his day was mentioned. He did not state a goal for tomorrow and had difficulty participating.

## 2019-03-20 NOTE — Tx Team (Signed)
Interdisciplinary Treatment and Diagnostic Plan Update  03/20/2019 Time of Session: 9:00am Furnell Hughart MRN: [id number]  Principal Diagnosis: MDD (major depressive disorder), recurrent severe, without psychosis (HCC)  Secondary Diagnoses: Principal Problem:   MDD (major depressive disorder), recurrent severe, without psychosis (HCC) Active Problems:   Methamphetamine-induced psychotic disorder (HCC)   Current Medications:  Current Facility-Administered Medications  Medication Dose Route Frequency Provider Last Rate Last Dose  . acetaminophen (TYLENOL) tablet 650 mg  650 mg Oral Q6H PRN Laveda Abbe, NP      . alum & mag hydroxide-simeth (MAALOX/MYLANTA) 200-200-20 MG/5ML suspension 30 mL  30 mL Oral Q4H PRN Laveda Abbe, NP      . cephALEXin [facility]) capsule 500 mg  500 mg Oral BID Laveda Abbe, NP   500 mg at 03/20/19 0759  . feeding supplement (ENSURE ENLIVE) (ENSURE ENLIVE) liquid 237 mL  237 mL Oral BID BM Cobos, Rockey Situ, MD   237 mL at 03/20/19 0759  . gabapentin (NEURONTIN) capsule 300 mg  300 mg Oral TID Malvin Johns, MD   300 mg at 03/20/19 0800  . hydrOXYzine (ATARAX/VISTARIL) tablet 25 mg  25 mg Oral TID PRN Laveda Abbe, NP   25 mg at 03/17/19 2104  . magnesium hydroxide (MILK OF MAGNESIA) suspension 30 mL  30 mL Oral Daily PRN Laveda Abbe, NP      . nicotine (NICODERM CQ - dosed in mg/24 hours) patch 21 mg  21 mg Transdermal Daily Cobos, Rockey Situ, MD   21 mg at 03/19/19 0819  . QUEtiapine (SEROQUEL) tablet 25 mg  25 mg Oral TID PRN Money, Gerlene Burdock, FNP      . temazepam (RESTORIL) capsule 30 mg  30 mg Oral QHS Malvin Johns, MD   30 mg at 03/19/19 2107  . traZODone (DESYREL) tablet 50 mg  50 mg Oral QHS PRN Laveda Abbe, NP      . venlafaxine XR [facility]) 24 hr capsule 37.5 mg  37.5 mg Oral Daily Money, Gerlene Burdock, FNP   37.5 mg at 03/20/19 2751   PTA Medications: Medications Prior to Admission  Medication Sig  Dispense Refill Last Dose  . FLUoxetine (PROZAC) 20 MG capsule Take 1 capsule (20 mg total) by mouth daily. 90 capsule 1 Has not picked up rx yet    Patient Stressors: Legal issue Substance abuse  Patient Strengths: Capable of independent living Motivation for treatment/growth  Treatment Modalities: Medication Management, Group therapy, Case management,  1 to 1 session with clinician, Psychoeducation, Recreational therapy.   Physician Treatment Plan for Primary Diagnosis: MDD (major depressive disorder), recurrent severe, without psychosis (HCC) Long Term Goal(s): Improvement in symptoms so as ready for discharge Improvement in symptoms so as ready for discharge   Short Term Goals: Ability to maintain clinical measurements within normal limits will improve Ability to demonstrate self-control will improve  Medication Management: Evaluate patient's response, side effects, and tolerance of medication regimen.  Therapeutic Interventions: 1 to 1 sessions, Unit Group sessions and Medication administration.  Evaluation of Outcomes: Progressing  Physician Treatment Plan for Secondary Diagnosis: Principal Problem:   MDD (major depressive disorder), recurrent severe, without psychosis (HCC) Active Problems:   Methamphetamine-induced psychotic disorder (HCC)  Long Term Goal(s): Improvement in symptoms so as ready for discharge Improvement in symptoms so as ready for discharge   Short Term Goals: Ability to maintain clinical measurements within normal limits will improve Ability to demonstrate self-control will improve     Medication Management: Evaluate  patient's response, side effects, and tolerance of medication regimen.  Therapeutic Interventions: 1 to 1 sessions, Unit Group sessions and Medication administration.  Evaluation of Outcomes: Progressing   RN Treatment Plan for Primary Diagnosis: MDD (major depressive disorder), recurrent severe, without psychosis (HCC) Long Term  Goal(s): Knowledge of disease and therapeutic regimen to maintain health will improve  Short Term Goals: Ability to demonstrate self-control, Ability to identify and develop effective coping behaviors will improve and Compliance with prescribed medications will improve  Medication Management: RN will administer medications as ordered by provider, will assess and evaluate patient's response and provide education to patient for prescribed medication. RN will report any adverse and/or side effects to prescribing provider.  Therapeutic Interventions: 1 on 1 counseling sessions, Psychoeducation, Medication administration, Evaluate responses to treatment, Monitor vital signs and CBGs as ordered, Perform/monitor CIWA, COWS, AIMS and Fall Risk screenings as ordered, Perform wound care treatments as ordered.  Evaluation of Outcomes: Progressing   LCSW Treatment Plan for Primary Diagnosis: MDD (major depressive disorder), recurrent severe, without psychosis (HCC) Long Term Goal(s): Safe transition to appropriate next level of care at discharge, Engage patient in therapeutic group addressing interpersonal concerns.  Short Term Goals: Engage patient in aftercare planning with referrals and resources, Increase social support, Increase emotional regulation, Identify triggers associated with mental health/substance abuse issues and Increase skills for wellness and recovery  Therapeutic Interventions: Assess for all discharge needs, 1 to 1 time with Social worker, Explore available resources and support systems, Assess for adequacy in community support network, Educate family and significant other(s) on suicide prevention, Complete Psychosocial Assessment, Interpersonal group therapy.  Evaluation of Outcomes: Progressing   Progress in Treatment: Attending groups: Yes. Participating in groups: Yes. Taking medication as prescribed: Yes. Toleration medication: Yes. Family/Significant other contact made: Yes,  individual(s) contacted:  patient declined consents, SPE reviewed with patient. Patient understands diagnosis: Yes. Discussing patient identified problems/goals with staff: Yes. Medical problems stabilized or resolved: Yes. Denies suicidal/homicidal ideation: No. Issues/concerns per patient self-inventory: Yes.  New problem(s) identified: Yes, Describe:  legal issues  New Short Term/Long Term Goal(s): detox, medication management for mood stabilization; elimination of SI thoughts; development of comprehensive mental wellness/sobriety plan.  Patient Goals:  Wants to go to residential treatment  Discharge Plan or Barriers: CSW assessing for appropriate referrals, legal issues and reduced availability of residential treatment facilities are barriers. [facility] pamphlet, Mobile Crisis information, and AA/NA information provided to patient for additional community support and resources.   Reason for Continuation of Hospitalization: Anxiety Depression Suicidal ideation  Estimated Length of Stay: 1-3 days  Attendees: Patient: Jon Peterson 03/20/2019 8:54 AM  Physician:  03/20/2019 8:54 AM  Nursing:  03/20/2019 8:54 AM  RN Care Manager: 03/20/2019 8:54 AM  Social Worker: Enid Cutter, LCSWA 03/20/2019 8:54 AM  Recreational Therapist:  03/20/2019 8:54 AM  Other:  03/20/2019 8:54 AM  Other:  03/20/2019 8:54 AM  Other: 03/20/2019 8:54 AM    Scribe for Treatment Team: Darreld Mclean, LCSWA 03/20/2019 8:54 AM

## 2019-03-20 NOTE — Progress Notes (Signed)
Recreation Therapy Notes  Date:  4.6.20 Time: 0930 Location: [address] Dayroom  Group Topic: Stress Management  Goal Area(s) Addresses:  Patient will identify positive stress management techniques. Patient will identify benefits of using stress management post d/c.  Behavioral Response:  Engaged  Intervention:  Stress Management  Activity :  Guided Imagery.  LRT introduced the stress management technique of guided imagery.  LRT read a script that guided patients in envisioning their peaceful place.  Patients were to listen and follow along as script was read to engage in activity.    Education:  Stress Management, Discharge Planning.   Education Outcome: Acknowledges Education  Clinical Observations/Feedback:  Pt attended and participated in group.    Jon Peterson, LRT/CTRS         Jon Peterson A 03/20/2019 10:38 AM 

## 2019-03-20 NOTE — Progress Notes (Signed)
[facility] MD Progress Note  03/20/2019 12:52 PM Jon Peterson  MRN:  [id number] Subjective:    Patient seen he is still twitching and little bit anxious and complaining of anxiety when asked what would keep him away from methamphetamines he really does not have any answer he continues to crave them but does not dream abusing.  Psychosis has resolved for the most part but is a bit guarded but no formed paranoid delusions.  No hallucinations or thoughts of self-harm Principal Problem: MDD (major depressive disorder), recurrent severe, without psychosis (HCC) Diagnosis: Principal Problem:   MDD (major depressive disorder), recurrent severe, without psychosis (HCC) Active Problems:   Methamphetamine-induced psychotic disorder (HCC)  Total Time spent with patient: 20 minutes  Past Medical History:  Past Medical History:  Diagnosis Date  . Depression   . Hypercholesterolemia     Past Surgical History:  Procedure Laterality Date  . SURGERY SCROTAL / TESTICULAR     Family History: History reviewed. No pertinent family history.  Social History:  Social History   Substance and Sexual Activity  Alcohol Use Not Currently     Social History   Substance and Sexual Activity  Drug Use Yes  . Types: Amphetamines, Methamphetamines    Social History   Socioeconomic History  . Marital status: Legally Separated    Spouse name: Not on file  . Number of children: Not on file  . Years of education: Not on file  . Highest education level: Not on file  Occupational History  . Not on file  Social Needs  . Financial resource strain: Not on file  . Food insecurity:    Worry: Not on file    Inability: Not on file  . Transportation needs:    Medical: Not on file    Non-medical: Not on file  Tobacco Use  . Smoking status: Current Every Day Smoker    Packs/day: 1.00  . Smokeless tobacco: Former Engineer, water and Sexual Activity  . Alcohol use: Not Currently  . Drug use: Yes    Types:  Amphetamines, Methamphetamines  . Sexual activity: Not Currently  Lifestyle  . Physical activity:    Days per week: Not on file    Minutes per session: Not on file  . Stress: Not on file  Relationships  . Social connections:    Talks on phone: Not on file    Gets together: Not on file    Attends religious service: Not on file    Active member of club or organization: Not on file    Attends meetings of clubs or organizations: Not on file    Relationship status: Not on file  Other Topics Concern  . Not on file  Social History Narrative  . Not on file   Additional Social History:                         Sleep: Good  Appetite:  Good  Current Medications: Current Facility-Administered Medications  Medication Dose Route Frequency Provider Last Rate Last Dose  . acetaminophen (TYLENOL) tablet 650 mg  650 mg Oral Q6H PRN Laveda Abbe, NP      . alum & mag hydroxide-simeth (MAALOX/MYLANTA) 200-200-20 MG/5ML suspension 30 mL  30 mL Oral Q4H PRN Laveda Abbe, NP      . cephALEXin [facility]) capsule 500 mg  500 mg Oral BID Laveda Abbe, NP   500 mg at 03/20/19 0759  . feeding supplement (ENSURE  ENLIVE) (ENSURE ENLIVE) liquid 237 mL  237 mL Oral BID BM Cobos, Rockey Situ, MD   237 mL at 03/20/19 0759  . gabapentin (NEURONTIN) capsule 300 mg  300 mg Oral TID Malvin Johns, MD   300 mg at 03/20/19 1209  . hydrOXYzine (ATARAX/VISTARIL) tablet 25 mg  25 mg Oral TID PRN Laveda Abbe, NP   25 mg at 03/17/19 2104  . iloperidone (FANAPT) tablet 4 mg  4 mg Oral TID Malvin Johns, MD      . magnesium hydroxide (MILK OF MAGNESIA) suspension 30 mL  30 mL Oral Daily PRN Laveda Abbe, NP      . nicotine (NICODERM CQ - dosed in mg/24 hours) patch 21 mg  21 mg Transdermal Daily Cobos, Rockey Situ, MD   21 mg at 03/19/19 0819  . temazepam (RESTORIL) capsule 30 mg  30 mg Oral QHS Malvin Johns, MD   30 mg at 03/19/19 2107  . traZODone (DESYREL) tablet 50 mg   50 mg Oral QHS PRN Laveda Abbe, NP      . venlafaxine XR [facility]) 24 hr capsule 37.5 mg  37.5 mg Oral Daily Money, Gerlene Burdock, FNP   37.5 mg at 03/20/19 0177    Lab Results: No results found for this or any previous visit (from the past 48 hour(s)).  Blood Alcohol level:  Lab Results  Component Value Date   ETH <10 03/15/2019   ETH <10 03/07/2019    Metabolic Disorder Labs: No results found for: HGBA1C, MPG No results found for: PROLACTIN No results found for: CHOL, TRIG, HDL, CHOLHDL, VLDL, LDLCALC  Physical Findings: AIMS: Facial and Oral Movements Muscles of Facial Expression: None, normal Lips and Perioral Area: None, normal Jaw: None, normal Tongue: None, normal,Extremity Movements Upper (arms, wrists, hands, fingers): None, normal Lower (legs, knees, ankles, toes): None, normal, Trunk Movements Neck, shoulders, hips: None, normal, Overall Severity Severity of abnormal movements (highest score from questions above): None, normal Incapacitation due to abnormal movements: None, normal Patient's awareness of abnormal movements (rate only patient's report): No Awareness, Dental Status Current problems with teeth and/or dentures?: No Does patient usually wear dentures?: No  CIWA:  CIWA-Ar Total: 3 COWS:  COWS Total Score: 6  Musculoskeletal: Strength & Muscle Tone: within normal limits Gait & Station: normal Patient leans: N/A  Psychiatric Specialty Exam: Physical Exam  ROS  Blood pressure 131/83, pulse 89, temperature (!) 97.4 F (36.3 C), temperature source Oral, resp. rate 18, height 5\' 8"  (1.727 m), weight 83.9 kg, SpO2 100 %.Body mass index is 28.13 kg/m.  General Appearance: Casual  Eye Contact:  Fair  Speech:  Pressured  Volume:  Normal  Mood:  Anxious and Dysphoric  Affect:  Appropriate and Constricted  Thought Process:  Coherent  Orientation:  Full (Time, Place, and Person)  Thought Content:  Tangential  Suicidal Thoughts:  No  Homicidal  Thoughts:  No  Memory:  Immediate;   Fair  Judgement:  Fair  Insight:  Fair  Psychomotor Activity:  Increased  Concentration:  Concentration: Fair  Recall:  [employer] of Knowledge:  Fair  Language:  Fair  Akathisia:  Negative  Handed:  Right  AIMS (if indicated):     Assets:  Leisure Time Physical Health  ADL's:  Intact  Cognition:  WNL  Sleep:  Number of Hours: 6.5     Treatment Plan Summary: Daily contact with patient to assess and evaluate symptoms and progress in treatment, Medication management and Plan  Continue current precautions and continue cognitive-based therapy add Fanapt discontinue quetiapine try that for anxiety  Malvin JohnsFARAH,Quenesha Douglass, MD 03/20/2019, 12:52 PM

## 2019-03-21 NOTE — Progress Notes (Signed)
The patient rated his day as a 5 out of a possible 10. He explained that he felt "melancholy" and that he had a slow day overall. He did not go into greater detail about his day. His goal for tomorrow is to get discharged and "handle business".

## 2019-03-21 NOTE — Progress Notes (Signed)
[facility]  03/21/2019 8:11 AM Jon PiChristopher Peterson  MRN:  [id number] Subjective:    Patient reports that he slept well he reports that he has less twitching and less restlessness on the I iloperidone versus the quetiapine. Clearly has had some protracted symptoms from his methamphetamine intoxication. Denies cravings Does laugh a bit when asked about his ability to stay clean so he is not critically dedicated to rehab or staying clean at this point in time As per social work notes barriers to meaningful residential rehab include court dates for assault, larceny, and limited facility availability due to current pandemic.  Principal Problem: MDD (major depressive disorder), recurrent severe, without psychosis (HCC) Diagnosis: Principal Problem:   MDD (major depressive disorder), recurrent severe, without psychosis (HCC) Active Problems:   Methamphetamine-induced psychotic disorder (HCC)  Total Time spent with patient: 20 minutes  Past Medical History:  Past Medical History:  Diagnosis Date  . Depression   . Hypercholesterolemia     Past Surgical History:  Procedure Laterality Date  . SURGERY SCROTAL / TESTICULAR     Family History: History reviewed. No pertinent family history.  Social History:  Social History   Substance and Sexual Activity  Alcohol Use Not Currently     Social History   Substance and Sexual Activity  Drug Use Yes  . Types: Amphetamines, Methamphetamines    Social History   Socioeconomic History  . Marital status: Legally Separated    Spouse name: Not on file  . Number of children: Not on file  . Years of education: Not on file  . Highest education level: Not on file  Occupational History  . Not on file  Social Needs  . Financial resource strain: Not on file  . Food insecurity:    Worry: Not on file    Inability: Not on file  . Transportation needs:    Medical: Not on file    Non-medical: Not on file  Tobacco Use  . Smoking  status: Current Every Day Smoker    Packs/day: 1.00  . Smokeless tobacco: Former Engineer, waterUser  Substance and Sexual Activity  . Alcohol use: Not Currently  . Drug use: Yes    Types: Amphetamines, Methamphetamines  . Sexual activity: Not Currently  Lifestyle  . Physical activity:    Days per week: Not on file    Minutes per session: Not on file  . Stress: Not on file  Relationships  . Social connections:    Talks on phone: Not on file    Gets together: Not on file    Attends religious service: Not on file    Active member of club or organization: Not on file    Attends meetings of clubs or organizations: Not on file    Relationship status: Not on file  Other Topics Concern  . Not on file  Social History Narrative  . Not on file   Additional Social History:                         Sleep: Good  Appetite:  Good  Current Medications: Current Facility-Administered Medications  Medication Dose Route Frequency Provider Last Rate Last Dose  . acetaminophen (TYLENOL) tablet 650 mg  650 mg Oral Q6H PRN Laveda AbbeParks, Laurie Britton, NP      . alum & mag hydroxide-simeth (MAALOX/MYLANTA) 200-200-20 MG/5ML suspension 30 mL  30 mL Oral Q4H PRN Laveda AbbeParks, Laurie Britton, NP      . cephALEXin [facility](KEFLEX) capsule  500 mg  500 mg Oral BID Laveda Abbe, NP   500 mg at 03/20/19 1725  . feeding supplement (ENSURE ENLIVE) (ENSURE ENLIVE) liquid 237 mL  237 mL Oral BID BM Cobos, Rockey Situ, MD   237 mL at 03/20/19 0759  . gabapentin (NEURONTIN) capsule 300 mg  300 mg Oral TID Malvin Johns, MD   300 mg at 03/20/19 1724  . hydrOXYzine (ATARAX/VISTARIL) tablet 25 mg  25 mg Oral TID PRN Laveda Abbe, NP   25 mg at 03/17/19 2104  . iloperidone (FANAPT) tablet 4 mg  4 mg Oral TID Malvin Johns, MD   4 mg at 03/20/19 1724  . magnesium hydroxide (MILK OF MAGNESIA) suspension 30 mL  30 mL Oral Daily PRN Laveda Abbe, NP      . nicotine (NICODERM CQ - dosed in mg/24 hours) patch 21 mg  21 mg  Transdermal Daily Cobos, Rockey Situ, MD   21 mg at 03/19/19 0819  . temazepam (RESTORIL) capsule 30 mg  30 mg Oral QHS Malvin Johns, MD   30 mg at 03/19/19 2107  . traZODone (DESYREL) tablet 50 mg  50 mg Oral QHS PRN Laveda Abbe, NP      . venlafaxine XR [facility]) 24 hr capsule 37.5 mg  37.5 mg Oral Daily Money, Gerlene Burdock, FNP   37.5 mg at 03/20/19 6962    Lab Results: No results found for this or any previous visit (from the past 48 hour(s)).  Blood Alcohol level:  Lab Results  Component Value Date   ETH <10 03/15/2019   ETH <10 03/07/2019    Metabolic Disorder Labs: No results found for: HGBA1C, MPG No results found for: PROLACTIN No results found for: CHOL, TRIG, HDL, CHOLHDL, VLDL, LDLCALC  Physical Findings: AIMS: Facial and Oral Movements Muscles of Facial Expression: None, normal Lips and Perioral Area: None, normal Jaw: None, normal Tongue: None, normal,Extremity Movements Upper (arms, wrists, hands, fingers): None, normal Lower (legs, knees, ankles, toes): None, normal, Trunk Movements Neck, shoulders, hips: None, normal, Overall Severity Severity of abnormal movements (highest score from questions above): None, normal Incapacitation due to abnormal movements: None, normal Patient's awareness of abnormal movements (rate only patient's report): No Awareness, Dental Status Current problems with teeth and/or dentures?: No Does patient usually wear dentures?: No  CIWA:  CIWA-Ar Total: 3 COWS:  COWS Total Score: 6  Musculoskeletal: Strength & Muscle Tone: within normal limits Gait & Station: normal Patient leans: N/A  Psychiatric Specialty Exam: Physical Exam  ROS  Blood pressure 131/83, pulse 89, temperature (!) 97.4 F (36.3 C), temperature source Oral, resp. rate 18, height 5\' 8"  (1.727 m), weight 83.9 kg, SpO2 100 %.Body mass index is 28.13 kg/m.  General Appearance: Casual  Eye Contact:  None  Speech:  Normal Rate  Volume:  Decreased  Mood:   Dysphoric  Affect:  Appropriate  Thought Process:  Coherent  Orientation:  Full (Time, Place, and Person)  Thought Content:  Tangential  Suicidal Thoughts:  No  Homicidal Thoughts:  none  Memory:  Recent;   Fair  Judgement:  Fair  Insight:  Shallow  Psychomotor Activity:  Normal  Concentration:  Concentration: Fair  Recall:  [employer] of Knowledge:  Fair  Language:  Fair  Akathisia:  Negative  Handed:  Right  AIMS (if indicated):     Assets:  Leisure Time Physical Health Resilience  ADL's:  Intact  Cognition:  WNL  Sleep:  Number of Hours: 6.75  Treatment Plan Summary: Daily contact with patient to assess and evaluate symptoms and progress in treatment, Medication management and Plan Continue current medications without change continue cognitive-based and rehab based therapies again patient's dedication to rehab is questionable but probable discharge tomorrow  Malvin Johns, MD 03/21/2019, 8:11 AM

## 2019-03-21 NOTE — Progress Notes (Signed)
Patient ID: Jon Peterson, male   DOB: 08/31/1981, 38 y.o.   MRN: [id number]  Nursing Progress Note [id number]  On initial approach, patient was seen resting in bed but did get up for scheduled medications. Patient presents sullen and minimally engages with Clinical research associate. Patient compliant with scheduled medications and denies concerns. Patient currently denies SI/HI/AVH. Patient declined to complete self-inventory sheet.  Patient is educated about and provided medication per provider's orders. Patient safety maintained with q15 min safety checks and low fall risk precautions. Emotional support given, 1:1 interaction, and active listening provided. Patient encouraged to attend meals, groups, and work on treatment plan and goals. Labs, vital signs and patient behavior monitored throughout shift.   Patient contracts for safety with staff. Patient remains safe on the unit at this time and agrees to come to staff with any issues/concerns. Will continue to support and monitor.

## 2019-03-21 NOTE — BHH Group Notes (Signed)
Adult Psychoeducational Group Note  Date:  03/21/2019 Time:  9:36 AM  Group Topic/Focus:  Healthy Communication:   The focus of this group is to discuss communication, barriers to communication, as well as healthy ways to communicate with others.  Participation Level:  Minimal  Participation Quality:  Appropriate  Affect:  Appropriate  Cognitive:  Appropriate  Insight: Appropriate  Engagement in Group:  Limited  Modes of Intervention:  Discussion  Additional Comments:  Pt attended morning goals group.  Deforest Hoyles Safiyya Stokes 03/21/2019, 9:36 AM

## 2019-03-21 NOTE — Plan of Care (Signed)
  Problem: Education: Goal: Knowledge of [facility] Education information/materials will improve Outcome: Progressing   Problem: Health Behavior/Discharge Planning: Goal: Compliance with treatment plan for underlying cause of condition will improve Outcome: Progressing   Problem: Safety: Goal: Periods of time without injury will increase Outcome: Progressing   

## 2019-03-21 NOTE — [facility] Group Notes (Signed)
[facility] Group Notes:  Nursing Psychoeducation  Date:  03/21/2019  Time:  2:00 PM  Type of Therapy:  Psychoeducational Skills   Group Topic: Identifying Anxiety Triggers, Debunking Cognitive Distortions, and Utilizing Coping Skills  Participation Level:  Active  Participation Quality:  Appropriate  Affect:  Blunted  Cognitive:  Alert and Oriented  Insight:  Improving  Engagement in Group:  Developing/Improving  Modes of Intervention:  Discussion, Education and Socialization  Summary of Progress/Problems: Patient discussed how his upcoming custody trail causes anxiety. Patient reports he has not been taking care of himself or prioritizing his mental health. Patient was limited in his ability to identify positive coping skills and reports he has been using substances. Patient was engaged in discussion, shared appropriately but needs to continue to identify alternative coping skills.  Marchelle Folks A Agness Sibrian 03/21/2019, 2:50 PM

## 2019-03-22 MED ORDER — VENLAFAXINE HCL ER 75 MG PO CP24
75.0000 mg | ORAL_CAPSULE | Freq: Every day | ORAL | Status: DC
  Filled 2019-03-22: qty 10

## 2019-03-22 MED ORDER — ILOPERIDONE 4 MG PO TABS: 4.0000 mg | ORAL_TABLET | Freq: Three times a day (TID) | ORAL | 2 refills | Status: DC

## 2019-03-22 MED ORDER — GABAPENTIN 300 MG PO CAPS: 300.0000 mg | ORAL_CAPSULE | Freq: Three times a day (TID) | ORAL | 2 refills | Status: DC

## 2019-03-22 MED ORDER — VENLAFAXINE HCL ER 75 MG PO CP24: 75.0000 mg | ORAL_CAPSULE | Freq: Every day | ORAL | 2 refills | Status: DC

## 2019-03-22 NOTE — Progress Notes (Signed)
Recreation Therapy Notes  Date:  4.8.20 Time: 0930 Location: [address] Dayroom  Group Topic: Stress Management  Goal Area(s) Addresses:  Patient will identify positive stress management techniques. Patient will identify benefits of using stress management post d/c.  Behavioral Response:  Engaged  Intervention: Stress Management  Activity :  Meditation.  LRT introduced the stress management technique of meditation.  LRT played a meditation that focused on making the most of your day and the possibilities it offers.  Patients were to follow along as meditation played to engage in activity.  Education:  Stress Management, Discharge Planning.   Education Outcome: Acknowledges Education  Clinical Observations/Feedback:  Pt attended and participated in group.     Caroll Rancher, LRT/CTRS    Caroll Rancher A 03/22/2019 10:25 AM

## 2019-03-22 NOTE — B[facility] Group Notes (Signed)
Adult Psychoeducational Group Note  Date:  03/22/2019 Time:  10:48 AM  Group Topic/Focus:  Crisis Planning:   The purpose of this group is to help patients create a crisis plan for use upon discharge or in the future, as needed.  Participation Level:  Active  Participation Quality:  Appropriate  Affect:  Appropriate  Cognitive:  Appropriate  Insight: Appropriate and Good  Engagement in Group:  Engaged  Modes of Intervention:  Discussion  Additional Comments:  Pt attended morning group. We talked about the action plan for finding work and resources after [facility]. Goals were about finding work and making sure the pt gets to see his daughter.   Jon Peterson 03/22/2019, 10:48 AM
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
## 2019-03-22 NOTE — Progress Notes (Signed)
Patient ID: Jon Peterson, male   DOB: 1981/04/18, 38 y.o.   MRN: [id number]  Discharge Note  Patient denies SI/HI and states readiness for discharge.  Written and verbal discharge instructions reviewed with the patient. Patient accepting to information and verbalized understanding with no concerns. All belongings returned to patient from the unit and secured lockers. Patient has completed their Suicide Safety Plan and has been provided Suicide Prevention resources. Patient provided an opportunity to complete and return Patient Satisfaction Survey.   Patient was safely escorted to the lobby for discharge. Patient discharged from [facility] with medication samples, prescriptions, personal belongings, follow-up appointment in place and discharge paperwork.

## 2019-03-22 NOTE — B[facility] Group Notes (Signed)
Adult Psychoeducational Group Note  Date:  03/22/2019 Time:  8:34 AM  Group Topic/Focus:  Goals Group:   The focus of this group is to help patients establish daily goals to achieve during treatment and discuss how the patient can incorporate goal setting into their daily lives to aide in recovery.  Participation Level:  Active  Participation Quality:  Appropriate  Affect:  Appropriate  Cognitive:  Appropriate  Insight: Appropriate  Engagement in Group:  Engaged  Modes of Intervention:  Discussion  Additional Comments:  Pt attended morning group. Pt was thinking he would be D/C'd. We talked about the discharge process.   Jon Peterson Jon Peterson 03/22/2019, 8:34 AM

## 2019-03-22 NOTE — Progress Notes (Signed)
D:  Jon Peterson was up and visible on the unit.  He attended evening wrap up group.  He denied SI/HI or A/V hallucinations.  He reported having trouble with diarrhea, "the consistency of ice cream."  Staffed with Karleen Hampshire PA, who declined ordering imodium due to being on antibiotic.  Encouraged him to increase fluids and notify staff if the diarrhea worsens.  He took his hs medications without difficulty.  He is currently resting with his eyes closed and appears to be asleep. A:  1:1 with RN for support and encouragement.  Medications as ordered.  Q 15 minute checks maintained for safety.  Encouraged participation in group and unit activities.   R:  Jon Peterson remains safe on the unit.  We will continue to monitor the progress towards his goals.

## 2019-03-22 NOTE — Plan of Care (Signed)
  Problem: Safety: Goal: Ability to remain free from injury will improve Outcome: Progressing   

## 2019-03-22 NOTE — Progress Notes (Signed)
  [facility] Adult Case Management Discharge Plan :  Will you be returning to the same living situation after discharge:  Yes,  home At discharge, do you have transportation home?: Yes,  taking a taxi, will pay for it himself Do you have the ability to pay for your medications: No. Referred to [facility].  Release of information consent forms completed and in the chart. Work letters on chart Patient to Follow up at: Follow-up Information    [facility] Follow up on 04/06/2019.   Specialty:  Professional Counselor Why:  Anger management group therapy on Thursday, 4/23 at 6:00PM.  Contact information: [address] [place] [address] [phone]        [facility] Follow up.   Why:  Hospital follow up appointment is  At this time the appointment will be conducted over the telephone.  The provider will contact patient.  Contact information: [address] [place] [address] [phone]           Next level of care provider has access to [facility] Link:no  Safety Planning and Suicide Prevention discussed: Yes,  with patient  Have you used any form of tobacco in the last 30 days? (Cigarettes, Smokeless Tobacco, Cigars, and/or Pipes): Yes  Has patient been referred to the Quitline?: Patient refused referral  Patient has been referred for addiction treatment: Yes  Darreld Mclean, LCSWA 03/22/2019, 9:05 AM

## 2019-03-23 LAB — HCV RT-PCR, QUANT (NON-GRAPH)
HCV log10: 5.45 log10 IU/mL
Hepatitis C Quantitation: 282000 IU/mL

## 2019-03-23 LAB — HCV AB W REFLEX TO QUANT PCR: HCV Ab: >11 s/co ratio — ABNORMAL HIGH (ref 0.0–0.9)

## 2019-11-27 ENCOUNTER — Emergency Department (HOSPITAL_BASED_OUTPATIENT_CLINIC_OR_DEPARTMENT_OTHER)
Admit: 2019-11-27 | Discharge: 2019-11-27 | Disposition: A | Discharge status: Home / Self Care | Payer: Self-pay | Attending: Emergency Medicine | Admitting: Emergency Medicine

## 2019-11-27 ENCOUNTER — Emergency Department (HOSPITAL_COMMUNITY): Payer: Self-pay

## 2019-11-27 ENCOUNTER — Encounter (HOSPITAL_COMMUNITY): Payer: Self-pay

## 2019-11-27 ENCOUNTER — Other Ambulatory Visit: Payer: Self-pay

## 2019-11-27 ENCOUNTER — Emergency Department (HOSPITAL_COMMUNITY)
Admission: EM | Admit: 2019-11-27 | Discharge: 2019-11-27 | Disposition: A | Discharge status: Home / Self Care | Payer: Self-pay | Attending: Emergency Medicine | Admitting: Emergency Medicine

## 2019-11-27 DIAGNOSIS — F1910 Other psychoactive substance abuse, uncomplicated: Secondary | ICD-10-CM | POA: Insufficient documentation

## 2019-11-27 DIAGNOSIS — M7989 Other specified soft tissue disorders: Secondary | ICD-10-CM

## 2019-11-27 DIAGNOSIS — L03113 Cellulitis of right upper limb: Secondary | ICD-10-CM | POA: Insufficient documentation

## 2019-11-27 DIAGNOSIS — F17200 Nicotine dependence, unspecified, uncomplicated: Secondary | ICD-10-CM | POA: Insufficient documentation

## 2019-11-27 DIAGNOSIS — L089 Local infection of the skin and subcutaneous tissue, unspecified: Secondary | ICD-10-CM

## 2019-11-27 LAB — BASIC METABOLIC PANEL
Anion gap: 10 (ref 5–15)
BUN: 17 mg/dL (ref 6–20)
CO2: 29 mmol/L (ref 22–32)
Calcium: 9.3 mg/dL (ref 8.9–10.3)
Chloride: 100 mmol/L (ref 98–111)
Creatinine, Ser: 0.83 mg/dL (ref 0.61–1.24)
GFR calc Af Amer: >60 mL/min (ref >60)
GFR calc non Af Amer: >60 mL/min (ref >60)
Glucose, Bld: 91 mg/dL (ref 70–99)
Potassium: 3.8 mmol/L (ref 3.5–5.1)
Sodium: 139 mmol/L (ref 135–145)

## 2019-11-27 LAB — CBC WITH DIFFERENTIAL/PLATELET
Abs Immature Granulocytes: 0.02 10*3/uL (ref 0.00–0.07)
Basophils Absolute: 0 10*3/uL (ref 0.0–0.1)
Basophils Relative: 0 %
Eosinophils Absolute: 0.1 10*3/uL (ref 0.0–0.5)
Eosinophils Relative: 1 %
HCT: 44.5 % (ref 39.0–52.0)
Hemoglobin: 15.1 g/dL (ref 13.0–17.0)
Immature Granulocytes: 0 %
Lymphocytes Relative: 21 %
Lymphs Abs: 1.8 10*3/uL (ref 0.7–4.0)
MCH: 32.5 pg (ref 26.0–34.0)
MCHC: 33.9 g/dL (ref 30.0–36.0)
MCV: 95.7 fL (ref 80.0–100.0)
Monocytes Absolute: 1.4 10*3/uL — ABNORMAL HIGH (ref 0.1–1.0)
Monocytes Relative: 16 %
Neutro Abs: 5.1 10*3/uL (ref 1.7–7.7)
Neutrophils Relative %: 62 %
Platelets: 215 10*3/uL (ref 150–400)
RBC: 4.65 MIL/uL (ref 4.22–5.81)
RDW: 12.4 % (ref 11.5–15.5)
WBC: 8.3 10*3/uL (ref 4.0–10.5)
nRBC: 0 % (ref 0.0–0.2)

## 2019-11-27 MED ORDER — SULFAMETHOXAZOLE-TRIMETHOPRIM 800-160 MG PO TABS: 1.0000 | ORAL_TABLET | Freq: Two times a day (BID) | ORAL | 0 refills | Status: AC

## 2019-11-27 MED ORDER — FENTANYL CITRATE (PF) 100 MCG/2ML IJ SOLN
100.0000 ug | Freq: Once | INTRAMUSCULAR | Status: AC
  Administered 2019-11-27: 100 ug via INTRAVENOUS
  Filled 2019-11-27: qty 2

## 2019-11-27 MED ORDER — VANCOMYCIN HCL 10 G IV SOLR
2000.0000 mg | Freq: Once | INTRAVENOUS | Status: AC
  Administered 2019-11-27: 07:00:00 2000 mg via INTRAVENOUS
  Filled 2019-11-27: qty 2000

## 2019-11-27 NOTE — ED Triage Notes (Signed)
Patient arrived stating started morning he noticed redness to the right arm around his elbow. Reports may be due to drug use.

## 2019-11-27 NOTE — ED Provider Notes (Signed)
Patient with a swollen right arm from injecting narcotics in his arm.  Ultrasound does not show any venous clots and does not show any abscesses.  Patient will be placed on Bactrim and will continue warm compresses.  And will follow-up in 2 to 3 days   Milton Ferguson, MD 11/27/19 1201

## 2019-11-27 NOTE — Progress Notes (Signed)
Right upper extremity venous duplex has been completed. Preliminary results can be found in CV Proc through chart review.  Results were given to Dr. Roderic Palau.  11/27/19 11:38 AM Jon Peterson RVT

## 2019-11-27 NOTE — Discharge Instructions (Addendum)
Use warm compresses to the arm daily.  Follow-up in 2 to 3 days for recheck of your arm with your family doctor or urgent care

## 2019-11-27 NOTE — Progress Notes (Signed)
A consult was received from an ED physician for vancomycin per pharmacy dosing.  The patient's profile has been reviewed for ht/wt/allergies/indication/available labs.   A one time order has been placed for Vancomycin 2 Gm .  Further antibiotics/pharmacy consults should be ordered by admitting physician if indicated.                       Thank you, Dorrene German 11/27/2019  6:19 AM

## 2019-11-27 NOTE — ED Provider Notes (Signed)
[facility] DEPT Provider Note   CSN: [id number] Arrival date & time: 11/27/19  0350     History Chief Complaint  Patient presents with  . Recurrent Skin Infections    Jon Peterson is a 38 y.o. male.  The history is provided by the patient.  Wound Check This is a new problem. The current episode started yesterday. The problem occurs constantly. The problem has been gradually worsening. Pertinent negatives include no chest pain and no shortness of breath. Exacerbated by: movement. Nothing relieves the symptoms. He has tried nothing for the symptoms.  Patient with history of depression and IV drug abuse presents with right arm pain.  He reports he injects methamphetamine, last use yesterday. He reports he is having redness and pain in the right elbow and forearm.  No fevers. Reports the pain is worsening     Past Medical History:  Diagnosis Date  . Depression   . Hypercholesterolemia     Patient Active Problem List   Diagnosis Date Noted  . MDD (major depressive disorder), recurrent severe, without psychosis ([place]) 03/17/2019  . Methamphetamine-induced psychotic disorder ([place])   . 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 MDD (major depressive disorder), recurrent, severe, with psychosis ([place]) 03/08/2019    Past Surgical History:  Procedure Laterality Date  . SURGERY SCROTAL / TESTICULAR         No family history on file.  Social History   Tobacco Use  . Smoking status: Current Every Day Smoker    Packs/day: 1.00  . Smokeless tobacco: Former Network engineer Use Topics  . Alcohol use: Not Currently  . Drug use: Yes    Types: Amphetamines, Methamphetamines    Home Medications Prior to Admission medications   Medication Sig Start Date End Date Taking? Authorizing Provider  gabapentin (NEURONTIN) 300 MG capsule Take 1 capsule (300 mg total) by mouth 3 (three) times daily. Patient not taking: Reported on  11/27/2019 03/22/19   Johnn Hai, MD  iloperidone (FANAPT) 4 MG TABS tablet Take 1 tablet (4 mg total) by mouth 3 (three) times daily. Patient not taking: Reported on 11/27/2019 03/22/19   Johnn Hai, MD  venlafaxine XR (EFFEXOR-XR) 75 MG 24 hr capsule Take 1 capsule (75 mg total) by mouth daily. Patient not taking: Reported on 11/27/2019 03/23/19   Johnn Hai, MD    Allergies    Patient has no known allergies.  Review of Systems   Review of Systems  Constitutional: Positive for chills. Negative for fever.  Respiratory: Negative for shortness of breath.   Cardiovascular: Negative for chest pain.  Skin: Positive for wound.  All other systems reviewed and are negative.   Physical Exam Updated Vital Signs BP 134/76 (BP Location: Right Arm)   Pulse (!) 107   Temp 98.5 F (36.9 C) (Oral)   Resp 18   Ht 1.727 m (5\' 8" )   Wt 86.2 kg   SpO2 95%   BMI 28.89 kg/m   Physical Exam CONSTITUTIONAL: Well developed/well nourished, anxious HEAD: Normocephalic/atraumatic EYES: EOMI ENMT: Mucous membranes moist NECK: supple no meningeal signs CV: S1/S2 noted, no murmurs/rubs/gallops noted LUNGS: Lungs are clear to auscultation bilaterally, no apparent distress ABDOMEN: soft, nontender NEURO: Pt is awake/alert/appropriate, moves all extremitiesx4.  No facial droop.   EXTREMITIES: pulses normal/equal, full ROM SKIN: warm, color normal, tenderness and erythema noted to medial aspect of right arm crosses from humerus into the forearm.  No crepitus.  There is induration but no fluctuance PSYCH: Anxious  Patient gave verbal permission to utilize photo for medical documentation only The image was not stored on any personal device  ED Results / Procedures / Treatments   Labs (all labs ordered are listed, but only abnormal results are displayed) Labs Reviewed  BASIC METABOLIC PANEL  CBC WITH DIFFERENTIAL/PLATELET    EKG None  Radiology DG Elbow Complete Right  Result Date:  11/27/2019 CLINICAL DATA:  Redness in the right arm.  History of drug use. EXAM: RIGHT ELBOW - COMPLETE 3+ VIEW COMPARISON:  None. FINDINGS: Subcutaneous reticulation along the medial more than lateral arm and forearm. New joint effusion, fracture, or erosion. IMPRESSION: Soft tissue swelling without opaque foreign body or gas. No osseous abnormality or joint effusion. Electronically Signed   By: Marnee Spring M.D.   On: 11/27/2019 06:27    Procedures Procedures   Medications Ordered in ED Medications  fentaNYL (SUBLIMAZE) injection 100 mcg (has no administration in time range)  vancomycin (VANCOCIN) 2,000 mg in sodium chloride 0.9 % 500 mL IVPB (has no administration in time range)    ED Course  I have reviewed the triage vital signs and the nursing notes.  Pertinent   imaging results that were available during my care of the patient were reviewed by me and considered in my medical decision making (see chart for details).    MDM Rules/Calculators/A&P                        7:08 AM Pt with h/o IVDA presents with cellulitis to his right arm.  No signs of abscess.  X-ray is negative.  He will require IV antibiotics. If The patient is improved he may be amenable to discharge with home antibiotics.  Patient is not currently septic appearing  Signed out to dr zammit at shift to change to check on patient after after given vancomycin and labs result Final Clinical Impression(s) / ED Diagnoses Final diagnoses:  Cellulitis of right upper extremity  Substance abuse [facility])    Rx / DC Orders ED Discharge Orders    None       Zadie Rhine, MD 11/27/19 [phone]

## 2019-12-26 ENCOUNTER — Other Ambulatory Visit: Payer: Self-pay

## 2019-12-26 ENCOUNTER — Encounter (HOSPITAL_COMMUNITY): Payer: Self-pay | Admitting: Emergency Medicine

## 2019-12-26 DIAGNOSIS — F17210 Nicotine dependence, cigarettes, uncomplicated: Secondary | ICD-10-CM | POA: Insufficient documentation

## 2019-12-26 DIAGNOSIS — L03113 Cellulitis of right upper limb: Secondary | ICD-10-CM | POA: Insufficient documentation

## 2019-12-26 DIAGNOSIS — F1510 Other stimulant abuse, uncomplicated: Secondary | ICD-10-CM | POA: Insufficient documentation

## 2019-12-26 NOTE — ED Triage Notes (Signed)
Pt c/o redness, pain and swelling to rt AC area.  Pt IV methamphetamine user.  Pt states that he also believes he may be beginning to have cellulitis in the left arm also.  Reusing needles.

## 2019-12-27 ENCOUNTER — Emergency Department (HOSPITAL_COMMUNITY)
Admission: EM | Admit: 2019-12-27 | Discharge: 2019-12-27 | Disposition: A | Discharge status: Home / Self Care | Payer: Self-pay | Attending: Emergency Medicine | Admitting: Emergency Medicine

## 2019-12-27 DIAGNOSIS — L03113 Cellulitis of right upper limb: Secondary | ICD-10-CM

## 2019-12-27 DIAGNOSIS — F1510 Other stimulant abuse, uncomplicated: Secondary | ICD-10-CM

## 2019-12-27 HISTORY — DX: Other psychoactive substance abuse, uncomplicated: F19.10

## 2019-12-27 LAB — CBC WITH DIFFERENTIAL/PLATELET
Abs Immature Granulocytes: 0.02 10*3/uL (ref 0.00–0.07)
Basophils Absolute: 0 10*3/uL (ref 0.0–0.1)
Basophils Relative: 0 %
Eosinophils Absolute: 0.1 10*3/uL (ref 0.0–0.5)
Eosinophils Relative: 1 %
HCT: 42.4 % (ref 39.0–52.0)
Hemoglobin: 14.8 g/dL (ref 13.0–17.0)
Immature Granulocytes: 0 %
Lymphocytes Relative: 26 %
Lymphs Abs: 2.2 10*3/uL (ref 0.7–4.0)
MCH: 32.5 pg (ref 26.0–34.0)
MCHC: 34.9 g/dL (ref 30.0–36.0)
MCV: 93 fL (ref 80.0–100.0)
Monocytes Absolute: 1.4 10*3/uL — ABNORMAL HIGH (ref 0.1–1.0)
Monocytes Relative: 16 %
Neutro Abs: 4.8 10*3/uL (ref 1.7–7.7)
Neutrophils Relative %: 57 %
Platelets: 215 10*3/uL (ref 150–400)
RBC: 4.56 MIL/uL (ref 4.22–5.81)
RDW: 11.9 % (ref 11.5–15.5)
WBC: 8.5 10*3/uL (ref 4.0–10.5)
nRBC: 0 % (ref 0.0–0.2)

## 2019-12-27 MED ORDER — DOXYCYCLINE HYCLATE 100 MG PO CAPS: 100.0000 mg | ORAL_CAPSULE | Freq: Two times a day (BID) | ORAL | 0 refills | Status: DC

## 2019-12-27 MED ORDER — KETOROLAC TROMETHAMINE 15 MG/ML IJ SOLN
15.0000 mg | Freq: Once | INTRAMUSCULAR | Status: AC
  Administered 2019-12-27: 02:00:00 15 mg via INTRAVENOUS
  Filled 2019-12-27: qty 1

## 2019-12-27 MED ORDER — DOXYCYCLINE HYCLATE 100 MG PO TABS
100.0000 mg | ORAL_TABLET | Freq: Once | ORAL | Status: AC
  Administered 2019-12-27: 04:00:00 100 mg via ORAL
  Filled 2019-12-27: qty 1

## 2019-12-27 MED ORDER — VANCOMYCIN HCL IN DEXTROSE 1-5 GM/200ML-% IV SOLN
1000.0000 mg | Freq: Once | INTRAVENOUS | Status: AC
  Administered 2019-12-27: 02:00:00 1000 mg via INTRAVENOUS
  Filled 2019-12-27: qty 200

## 2019-12-27 NOTE — ED Provider Notes (Signed)
[facility]-EMERGENCY DEPT Provider Note: Lowella Dell, MD, FACEP  CSN: [id number] MRN: [id number] ARRIVAL: 12/26/19 at 2336 ROOM: WA10/WA10   CHIEF COMPLAINT  Arm Pain   HISTORY OF PRESENT ILLNESS  12/27/19 1:54 AM Jon Peterson is a 38 y.o. male who is an IV methamphetamine user.  He admits to reusing needles.  He is here with erythema, pain and swelling to his right antecubital area.  There is associated pain which she rates as a 6 out of 10, worse with movement or palpation.  He denies fever or chills.   Past Medical History:  Diagnosis Date  . Depression   . Hypercholesterolemia   . Substance abuse [facility])     Past Surgical History:  Procedure Laterality Date  . SURGERY SCROTAL / TESTICULAR      No family history on file.  Social History   Tobacco Use  . Smoking status: Current Every Day Smoker    Packs/day: 1.00  . Smokeless tobacco: Former Engineer, water Use Topics  . Alcohol use: Not Currently  . Drug use: Yes    Types: Amphetamines, Methamphetamines    Prior to Admission medications   Medication Sig Start Date End Date Taking? Authorizing Provider  doxycycline (VIBRAMYCIN) 100 MG capsule Take 1 capsule (100 mg total) by mouth 2 (two) times daily. 12/27/19   Syenna Nazir, MD  gabapentin (NEURONTIN) 300 MG capsule Take 1 capsule (300 mg total) by mouth 3 (three) times daily. Patient not taking: Reported on 11/27/2019 03/22/19 12/27/19  Malvin Johns, MD  iloperidone (FANAPT) 4 MG TABS tablet Take 1 tablet (4 mg total) by mouth 3 (three) times daily. Patient not taking: Reported on 11/27/2019 03/22/19 12/27/19  Malvin Johns, MD  venlafaxine XR (EFFEXOR-XR) 75 MG 24 hr capsule Take 1 capsule (75 mg total) by mouth daily. Patient not taking: Reported on 11/27/2019 03/23/19 12/27/19  Malvin Johns, MD    Allergies Patient has no known allergies.   REVIEW OF SYSTEMS  Negative except as noted here or in the History of Present Illness.   PHYSICAL EXAMINATION    Initial Vital Signs Blood pressure 124/73, pulse 92, temperature 98.2 F (36.8 C), temperature source Oral, resp. rate 18, height 5\' 8"  (1.727 m), weight 86.2 kg, SpO2 100 %.  Examination General: Well-developed, well-nourished male in no acute distress; appearance consistent with age of record HENT: normocephalic; atraumatic Eyes: pupils equal, round and reactive to light; extraocular muscles intact Neck: supple Heart: regular rate and rhythm Lungs: clear to auscultation bilaterally Abdomen: soft; nondistended; nontender; bowel sounds present Extremities: No deformity; full range of motion; pulses normal Neurologic: Awake, alert and oriented; motor function intact in all extremities and symmetric; no facial droop Skin: Warm and dry; erythema and tenderness of right antecubital fossa and surrounding skin, area of induration but without pointing or fluctuance:    Psychiatric: Normal mood and affect   RESULTS  Summary of this visit's results, reviewed and interpreted by myself:   EKG Interpretation  Date/Time:    Ventricular Rate:    PR Interval:    QRS Duration:   QT Interval:    QTC Calculation:   R Axis:     Text Interpretation:        Laboratory Studies: Results for orders placed or performed during the hospital encounter of 12/27/19 (from the past 24 hour(s))  CBC with Differential     Status: Abnormal   Collection Time: 12/27/19  2:20 AM  Result Value Ref Range   WBC 8.5 4.0 -  10.5 K/uL   RBC 4.56 4.22 - 5.81 MIL/uL   Hemoglobin 14.8 13.0 - 17.0 g/dL   HCT 42.4 39.0 - 52.0 %   MCV 93.0 80.0 - 100.0 fL   MCH 32.5 26.0 - 34.0 pg   MCHC 34.9 30.0 - 36.0 g/dL   RDW 11.9 11.5 - 15.5 %   Platelets 215 150 - 400 K/uL   nRBC 0.0 0.0 - 0.2 %   Neutrophils Relative % 57 %   Neutro Abs 4.8 1.7 - 7.7 K/uL   Lymphocytes Relative 26 %   Lymphs Abs 2.2 0.7 - 4.0 K/uL   Monocytes Relative 16 %   Monocytes Absolute 1.4 (H) 0.1 - 1.0 K/uL   Eosinophils Relative 1 %    Eosinophils Absolute 0.1 0.0 - 0.5 K/uL   Basophils Relative 0 %   Basophils Absolute 0.0 0.0 - 0.1 K/uL   Immature Granulocytes 0 %   Abs Immature Granulocytes 0.02 0.00 - 0.07 K/uL   Imaging Studies: No results found.  ED COURSE and MDM  Nursing notes, initial and subsequent vitals signs, including pulse oximetry, reviewed and interpreted by myself.  Vitals:   12/26/19 2354 12/26/19 2355 12/27/19 0224  BP: 124/73  115/73  Pulse: 92  96  Resp: 18  18  Temp: 98.2 F (36.8 C)    TempSrc: Oral    SpO2: 100%  100%  Weight:  86.2 kg   Height:  5\' 8"  (1.727 m)    Medications  doxycycline (VIBRA-TABS) tablet 100 mg (has no administration in time range)  vancomycin (VANCOCIN) IVPB 1000 mg/200 mL premix (1,000 mg Intravenous New Bag/Given 12/27/19 0220)  ketorolac (TORADOL) 15 MG/ML injection 15 mg (15 mg Intravenous Given 12/27/19 0217)   3:36 AM Patient given 1 g of vancomycin in the ED.  We will send him out on doxycycline twice daily for 10 days.  He was advised to return if symptoms worsen.  He was also advised to look into drug rehab, and in the meantime a needle exchange program to minimize risk of infection in the future.   PROCEDURES  Procedures   ED DIAGNOSES     ICD-10-CM   1. 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

## 2020-01-04 ENCOUNTER — Encounter (HOSPITAL_COMMUNITY): Payer: Self-pay | Admitting: Emergency Medicine

## 2020-01-04 ENCOUNTER — Emergency Department (HOSPITAL_COMMUNITY)
Admission: EM | Admit: 2020-01-04 | Discharge: 2020-01-04 | Disposition: A | Discharge status: Home / Self Care | Payer: Self-pay | Attending: Emergency Medicine | Admitting: Emergency Medicine

## 2020-01-04 ENCOUNTER — Other Ambulatory Visit: Payer: Self-pay

## 2020-01-04 DIAGNOSIS — H01005 Unspecified blepharitis left lower eyelid: Secondary | ICD-10-CM | POA: Insufficient documentation

## 2020-01-04 DIAGNOSIS — H1032 Unspecified acute conjunctivitis, left eye: Secondary | ICD-10-CM | POA: Insufficient documentation

## 2020-01-04 DIAGNOSIS — F17210 Nicotine dependence, cigarettes, uncomplicated: Secondary | ICD-10-CM | POA: Insufficient documentation

## 2020-01-04 MED ORDER — TRIFLURIDINE 1 % OP SOLN: 1.0000 [drp] | OPHTHALMIC | 0 refills | Status: DC

## 2020-01-04 MED ORDER — ERYTHROMYCIN 5 MG/GM OP OINT: TOPICAL_OINTMENT | OPHTHALMIC | 1 refills | Status: DC

## 2020-01-04 MED ORDER — FLUORESCEIN SODIUM 1 MG OP STRP
2.0000 | ORAL_STRIP | Freq: Once | OPHTHALMIC | Status: AC
  Administered 2020-01-04: 2 via OPHTHALMIC
  Filled 2020-01-04: qty 2

## 2020-01-04 MED ORDER — TETRACAINE HCL 0.5 % OP SOLN
2.0000 [drp] | Freq: Once | OPHTHALMIC | Status: AC
  Administered 2020-01-04: 21:00:00 2 [drp] via OPHTHALMIC
  Filled 2020-01-04: qty 4

## 2020-01-04 NOTE — ED Provider Notes (Signed)
[name] EMERGENCY DEPARTMENT Provider Note   CSN: [id number] Arrival date & time: 01/04/20  1658     History Chief Complaint  Patient presents with  . Rash    Jon Peterson is a 39 y.o. male.  HPI Patient complains of left eye pain that began 2 days ago patient states that he has a rash that he describes as a swollen area in his left eyelid and states that he has redness and irritation of his left eye.  Denies any significant purulent discharge and states that he has some notable crusting of his eyelid/lashes in the mornings.  He states that he has had this occur 20 times in the past states that he was seen once by an ophthalmologist many years ago and given an antiviral eyedrop.  States that he has been represcribed this medication many times in the way seems all of his symptoms.  Denies any sick contacts.  Denies any foreign body sensation.  States that he does not use contacts.  Denies any vision changes states that his vision is somewhat abnormal at baseline.  Denies any chemical exposure, UV light exposure, snow sports etc.      Past Medical History:  Diagnosis Date  . Depression   . Hypercholesterolemia   . Substance abuse [facility])     Patient Active Problem List   Diagnosis Date Noted  . MDD (major depressive disorder), recurrent severe, without psychosis (HCC) 03/17/2019  . Methamphetamine-induced psychotic disorder (HCC)   . 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 SURGERY SCROTAL / TESTICULAR         No family history on file.  Social History   Tobacco Use  . Smoking status: Current Every Day Smoker    Packs/day: 1.00  . Smokeless tobacco: Former Engineer, water Use Topics  . Alcohol use: Not Currently  . Drug use: Yes    Types: Amphetamines, Methamphetamines    Home  Medications Prior to Admission medications   Medication Sig Start Date End Date Taking? Authorizing Provider  doxycycline (VIBRAMYCIN) 100 MG capsule Take 1 capsule (100 mg total) by mouth 2 (two) times daily. 12/27/19   Molpus, John, MD  erythromycin ophthalmic ointment Place a 1/2 inch ribbon of ointment into the lower eyelid. 01/04/20   Gailen Shelter, PA  trifluridine (VIROPTIC) 1 % ophthalmic solution Place 1 drop into the left eye every 4 (four) hours. 01/04/20   Gailen Shelter, PA  gabapentin (NEURONTIN) 300 MG capsule Take 1 capsule (300 mg total) by mouth 3 (three) times daily. Patient not taking: Reported on 11/27/2019 03/22/19 12/27/19  Malvin Johns, MD  iloperidone (FANAPT) 4 MG TABS tablet Take 1 tablet (4 mg total) by mouth 3 (three) times daily. Patient not taking: Reported on 11/27/2019 03/22/19 12/27/19  Malvin Johns, MD  venlafaxine XR (EFFEXOR-XR) 75 MG 24 hr capsule Take 1 capsule (75 mg total) by mouth daily. Patient not taking: Reported on 11/27/2019 03/23/19 12/27/19  Malvin Johns, MD    Allergies    Patient has no known allergies.  Review of Systems   Review of Systems  Constitutional: Negative for chills and fever.  HENT: Negative for congestion.   Eyes: Positive for pain, discharge and redness. Negative for photophobia and visual disturbance.  Respiratory: Negative for shortness of breath.   Cardiovascular: Negative for chest pain.  Gastrointestinal: Negative for  abdominal pain.  Musculoskeletal: Negative for neck pain.    Physical Exam Updated Vital Signs BP 121/85 (BP Location: Right Arm)   Pulse 96   Temp 98.3 F (36.8 C) (Oral)   Resp 16   SpO2 100%   Physical Exam Vitals and nursing note reviewed.  Constitutional:      General: He is not in acute distress.    Appearance: Normal appearance. He is not ill-appearing.  HENT:     Head: Normocephalic and atraumatic.  Eyes:     General: No scleral icterus.       Right eye: No discharge.        Left eye:  No discharge.     Conjunctiva/sclera: Conjunctivae normal.     Comments: EOMI, no ophthalmoplegia.  Vision grossly intact able to distinguish for and 2 fingers.  Pupils symmetric and reactive to light.  Globes are soft to palpation.  Lower left lid is mildly swollen consistent with blepharitis.  Conjunctive is mildly injected.  Woods lamp exam with no significant dye uptake.  No dendritic lesions.  No corneal abrasions.  No foreign bodies visualized with eyelid eversion.  Pulmonary:     Effort: Pulmonary effort is normal.     Breath sounds: No stridor.  Neurological:     Mental Status: He is alert and oriented to person, place, and time. Mental status is at baseline.     ED Results / Procedures / Treatments   Labs (all labs ordered are listed, but only abnormal results are displayed) Labs Reviewed - No data to display  EKG None  Radiology No results found.  Procedures Procedures (including critical care time)  Medications Ordered in ED Medications  fluorescein ophthalmic strip 2 strip (has no administration in time range)  tetracaine (PONTOCAINE) 0.5 % ophthalmic solution 2 drop (has no administration in time range)    ED Course  I have reviewed the triage vital signs and the nursing notes.  Pertinent labs & imaging results that were available during my care of the patient were reviewed by me and considered in my medical decision making (see chart for details).    MDM Rules/Calculators/A&P                      Patient 39 year old male presented today with left eye pain and redness.  Physical exam is unremarkable apart from mild left lower lid swelling and conjunctival injection.  Note foreign body sensation, no corneal abrasion.  No dendritic lesions to indicate herpetic eye infection.  No ophthalmoplegia to indicate orbital cellulits.  No proptosis.  No corneal abrasions.  Patient has not cut her lungs were.  Will discharge patient with erythromycin eye ointment and  patient's requested antiviral eyedrops.  Suspect some blepharitis so patient will also do warm compresses and engage in good lid hygiene.   Is well-appearing at time of discharge is agreeable to plan.  Will follow up with ophthalmologist.   The medical records were personally reviewed by myself. I personally reviewed all lab results and interpreted all imaging studies and either concurred with their official read or contacted radiology for clarification.   This patient appears reasonably screened and I doubt any other medical condition requiring further workup, evaluation, or treatment in the ED at this time prior to discharge.   Patient's vitals are WNL apart from vital sign abnormalities discussed above, patient is in NAD, and able to ambulate in the ED at their baseline and able to tolerate PO.  Pain  has been managed or a plan has been made for home management and has no complaints prior to discharge. Patient is comfortable with above plan and for discharge at this time. All questions were answered prior to disposition. Results from the ER workup discussed with the patient face to face and all questions answered to the best of my ability. The patient is safe for discharge with strict return precautions. Patient appears safe for discharge with appropriate follow-up. Conveyed my impression with the patient and they voiced understanding and are agreeable to plan.   An After Visit Summary was printed and given to the patient.  Portions of this note were generated with [employer]. Dictation errors may occur despite best attempts at proofreading.   I discussed this case with my attending physician who cosigned this note including patient's presenting symptoms, physical exam, and planned diagnostics and interventions. Attending physician stated agreement with plan or made changes to plan which were implemented.    Final Clinical Impression(s) / ED Diagnoses Final diagnoses:  Acute  bacterial conjunctivitis of left eye  Blepharitis of left lower eyelid, unspecified type    Rx / DC Orders ED Discharge Orders         Ordered    trifluridine (VIROPTIC) 1 % ophthalmic solution  Every 4 hours     01/04/20 1959    erythromycin ophthalmic ointment     01/04/20 [place], Desiree Fleming S, [place] 01/04/20 2101    Drenda Freeze, MD 01/04/20 2231

## 2020-01-04 NOTE — ED Notes (Signed)
Ordered eye drops & strips are at bedside.

## 2020-01-04 NOTE — Discharge Instructions (Signed)
Please use Trifluridine/viroptic eyedrops as prescribed.  Please also administer erythromycin eye ointment to the eye as prescribed. Please do not wear any contact lenses.  Please read his good lens hygiene and he may occasionally use warm compresses on the eye.

## 2020-01-04 NOTE — ED Triage Notes (Signed)
Pt reports recurrent episode of shingles that restarted 2 days ago pt has rash ago left eye with redness and left eye irritation. Pt states he has probably had this 20 times and normally goes away with antiviral.

## 2020-01-09 ENCOUNTER — Other Ambulatory Visit: Payer: Self-pay

## 2020-01-09 ENCOUNTER — Emergency Department (HOSPITAL_COMMUNITY)
Admission: EM | Admit: 2020-01-09 | Discharge: 2020-01-09 | Disposition: A | Discharge status: Home / Self Care | Payer: Self-pay | Attending: Emergency Medicine | Admitting: Emergency Medicine

## 2020-01-09 DIAGNOSIS — F17210 Nicotine dependence, cigarettes, uncomplicated: Secondary | ICD-10-CM | POA: Insufficient documentation

## 2020-01-09 DIAGNOSIS — F1910 Other psychoactive substance abuse, uncomplicated: Secondary | ICD-10-CM | POA: Insufficient documentation

## 2020-01-09 DIAGNOSIS — Z79899 Other long term (current) drug therapy: Secondary | ICD-10-CM | POA: Insufficient documentation

## 2020-01-09 LAB — COMPREHENSIVE METABOLIC PANEL
ALT: 191 U/L — ABNORMAL HIGH (ref 0–44)
AST: 105 U/L — ABNORMAL HIGH (ref 15–41)
Albumin: 4.2 g/dL (ref 3.5–5.0)
Alkaline Phosphatase: 46 U/L (ref 38–126)
Anion gap: 11 (ref 5–15)
BUN: 17 mg/dL (ref 6–20)
CO2: 27 mmol/L (ref 22–32)
Calcium: 9.3 mg/dL (ref 8.9–10.3)
Chloride: 100 mmol/L (ref 98–111)
Creatinine, Ser: 1.15 mg/dL (ref 0.61–1.24)
GFR calc Af Amer: >60 mL/min (ref >60)
GFR calc non Af Amer: >60 mL/min (ref >60)
Glucose, Bld: 90 mg/dL (ref 70–99)
Potassium: 4.2 mmol/L (ref 3.5–5.1)
Sodium: 138 mmol/L (ref 135–145)
Total Bilirubin: 3.4 mg/dL — ABNORMAL HIGH (ref 0.3–1.2)
Total Protein: 7.2 g/dL (ref 6.5–8.1)

## 2020-01-09 LAB — RAPID URINE DRUG SCREEN, HOSP PERFORMED
Amphetamines: POSITIVE — AB
Barbiturates: NOT DETECTED
Benzodiazepines: NOT DETECTED
Cocaine: NOT DETECTED
Opiates: NOT DETECTED
Tetrahydrocannabinol: NOT DETECTED

## 2020-01-09 LAB — URINALYSIS, ROUTINE W REFLEX MICROSCOPIC
Bilirubin Urine: NEGATIVE
Glucose, UA: NEGATIVE mg/dL
Hgb urine dipstick: NEGATIVE
Ketones, ur: 20 mg/dL — AB
Leukocytes,Ua: NEGATIVE
Nitrite: NEGATIVE
Protein, ur: NEGATIVE mg/dL
Specific Gravity, Urine: 1.018 (ref 1.005–1.030)
pH: 6 (ref 5.0–8.0)

## 2020-01-09 LAB — ETHANOL: Alcohol, Ethyl (B): <10 mg/dL (ref <10)

## 2020-01-09 LAB — CBC
HCT: 46.2 % (ref 39.0–52.0)
Hemoglobin: 15.7 g/dL (ref 13.0–17.0)
MCH: 32.2 pg (ref 26.0–34.0)
MCHC: 34 g/dL (ref 30.0–36.0)
MCV: 94.9 fL (ref 80.0–100.0)
Platelets: 187 10*3/uL (ref 150–400)
RBC: 4.87 MIL/uL (ref 4.22–5.81)
RDW: 11.9 % (ref 11.5–15.5)
WBC: 10.1 10*3/uL (ref 4.0–10.5)
nRBC: 0 % (ref 0.0–0.2)

## 2020-01-09 LAB — CBG MONITORING, ED: Glucose-Capillary: 85 mg/dL (ref 70–99)

## 2020-01-09 MED ORDER — SODIUM CHLORIDE 0.9 % IV BOLUS
1000.0000 mL | Freq: Once | INTRAVENOUS | Status: AC
  Administered 2020-01-09: 1000 mL via INTRAVENOUS

## 2020-01-09 MED ORDER — SODIUM CHLORIDE 0.9% FLUSH: 3.0000 mL | Freq: Once | INTRAVENOUS | Status: DC

## 2020-01-09 NOTE — ED Notes (Signed)
Patient given two bags of food and a water

## 2020-01-09 NOTE — ED Notes (Signed)
Patient will not give urine sample, stated "stop harassing me over Fing piss". This was the second time this EMT asked for the sample.

## 2020-01-09 NOTE — ED Notes (Signed)
Patient verbalizes understanding of discharge instructions. Opportunity for questioning and answers were provided. Armband removed by staff, pt discharged from ED.  

## 2020-01-09 NOTE — ED Provider Notes (Addendum)
[facility] EMERGENCY DEPARTMENT Provider Note   CSN: [id number] Arrival date & time: 01/09/20  1222     History Chief Complaint  Patient presents with  . Dizziness    Jon Peterson is a 39 y.o. male.  The history is provided by the patient. No language interpreter was used.  Dizziness Quality:  Unable to specify Severity:  Moderate Onset quality:  Gradual Timing:  Constant Chronicity:  New Relieved by:  Nothing Worsened by:  Nothing Risk factors: no hx of vertigo   Pt reports he feels dizzy.  Pt last used heroin 5 hours ago.  Pt unsure if he took anything else.       Past Medical History:  Diagnosis Date  . Depression   . Hypercholesterolemia   . Substance abuse [facility])     Patient Active Problem List   Diagnosis Date Noted  . MDD (major depressive disorder), recurrent severe, without psychosis (HCC) 03/17/2019  . Methamphetamine-induced psychotic disorder (HCC)   . 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 SURGERY SCROTAL / TESTICULAR         No family history on file.  Social History   Tobacco Use  . Smoking status: Current Every Day Smoker    Packs/day: 1.00  . Smokeless tobacco: Former Engineer, water Use Topics  . Alcohol use: Not Currently  . Drug use: Yes    Types: Amphetamines, Methamphetamines    Home Medications Prior to Admission medications   Medication Sig Start Date End Date Taking? Authorizing Provider  trifluridine (VIROPTIC) 1 % ophthalmic solution Place 1 drop into the left eye every 4 (four) hours. 01/04/20  Yes [place], Stevphen Meuse S, PA  doxycycline (VIBRAMYCIN) 100 MG capsule Take 1 capsule (100 mg total) by mouth 2 (two) times daily. 12/27/19   Molpus, John, MD  erythromycin ophthalmic ointment Place a 1/2 inch ribbon of ointment into the lower eyelid. 01/04/20    Gailen Shelter, PA  gabapentin (NEURONTIN) 300 MG capsule Take 1 capsule (300 mg total) by mouth 3 (three) times daily. Patient not taking: Reported on 11/27/2019 03/22/19 12/27/19  Malvin Johns, MD  iloperidone (FANAPT) 4 MG TABS tablet Take 1 tablet (4 mg total) by mouth 3 (three) times daily. Patient not taking: Reported on 11/27/2019 03/22/19 12/27/19  Malvin Johns, MD  venlafaxine XR (EFFEXOR-XR) 75 MG 24 hr capsule Take 1 capsule (75 mg total) by mouth daily. Patient not taking: Reported on 11/27/2019 03/23/19 12/27/19  Malvin Johns, MD    Allergies    Patient has no known allergies.  Review of Systems   Review of Systems  Neurological: Positive for dizziness.  All other systems reviewed and are negative.   Physical Exam Updated Vital Signs BP 125/82   Pulse (!) 106   Temp 98.3 F (36.8 C) (Temporal)   Resp (!) 21   SpO2 100%   Physical Exam Vitals and nursing note reviewed.  Constitutional:      Appearance: He is well-developed.  HENT:     Head: Normocephalic.     Nose: Nose normal.     Mouth/Throat:     Mouth: Mucous membranes are moist.  Eyes:     Pupils: Pupils are equal, round, and reactive to light.  Cardiovascular:     Rate and Rhythm: Normal rate and regular rhythm.  Pulmonary:     Effort: Pulmonary effort  is normal.  Abdominal:     General: There is no distension.  Musculoskeletal:        General: Normal range of motion.     Cervical back: Normal range of motion.  Skin:    General: Skin is warm.  Neurological:     Mental Status: He is alert and oriented to person, place, and time.     Comments: sleepy     ED Results / Procedures / Treatments   Labs (all labs ordered are listed, but only abnormal results are displayed) Labs Reviewed  COMPREHENSIVE METABOLIC PANEL - Abnormal; Notable for the following components:      Result Value   AST 105 (*)    ALT 191 (*)    Total Bilirubin 3.4 (*)    All other components within normal limits  CBC  ETHANOL   URINALYSIS, ROUTINE W REFLEX MICROSCOPIC  RAPID URINE DRUG SCREEN, HOSP PERFORMED  CBG MONITORING, ED    EKG EKG Interpretation  Date/Time:  Tuesday January 09 2020 12:21:34 EST Ventricular Rate:  89 PR Interval:  138 QRS Duration: 86 QT Interval:  364 QTC Calculation: 442 R Axis:   72 Text Interpretation: Normal sinus rhythm no acute ST/T changes no significant change since April 2020 Confirmed by Sherwood Gambler [phone]) on 01/09/2020 12:33:51 PM   Radiology No results found.  Procedures Procedures (including critical care time)  Medications Ordered in ED Medications  sodium chloride flush (NS) 0.9 % injection 3 mL (has no administration in time range)  sodium chloride 0.9 % bolus 1,000 mL (1,000 mLs Intravenous New Bag/Given 01/09/20 1326)    ED Course  I have reviewed the triage vital signs and the nursing notes.  Pertinent labs & imaging results that were available during my care of the patient were reviewed by me and considered in my medical decision making (see chart for details).    MDM Rules/Calculators/A&P                      MDM: Pt has elevation of ast and alt.  Pt sleepy but easily awaken.  Pt states he has not been eating or drinking.  Pt requesting food and drink.  .   Final Clinical Impression(s) / ED Diagnoses Final diagnoses:  Substance abuse ([place])    Rx / DC Orders ED Discharge Orders    None    An After Visit Summary was printed and given to the patient.    Fransico Meadow, PA-C 01/09/20 1451    [address] [place] 01/09/20 1534    Sherwood Gambler, MD 01/10/20 [phone]

## 2020-01-09 NOTE — ED Triage Notes (Signed)
[place] [facility] [place] [facility] patient called out for sudden onset dizziness, lightheaded, intermittent CP and SOB. Pt with hx of IV drug use, hx, shingles right eye. VSS in transport.

## 2020-01-09 NOTE — Discharge Instructions (Signed)
Return if any problems.  Drink plenty of fluids.   °

## 2020-03-29 IMAGING — DX LEFT ELBOW - COMPLETE 3+ VIEW
4 series · 4 of 4 positions shown · non-contrast
Comparison: None.

CLINICAL DATA: Left elbow pain and swelling without known injury.

EXAM:
LEFT ELBOW - COMPLETE 3+ VIEW

[abdomen kub (1 of 4)]
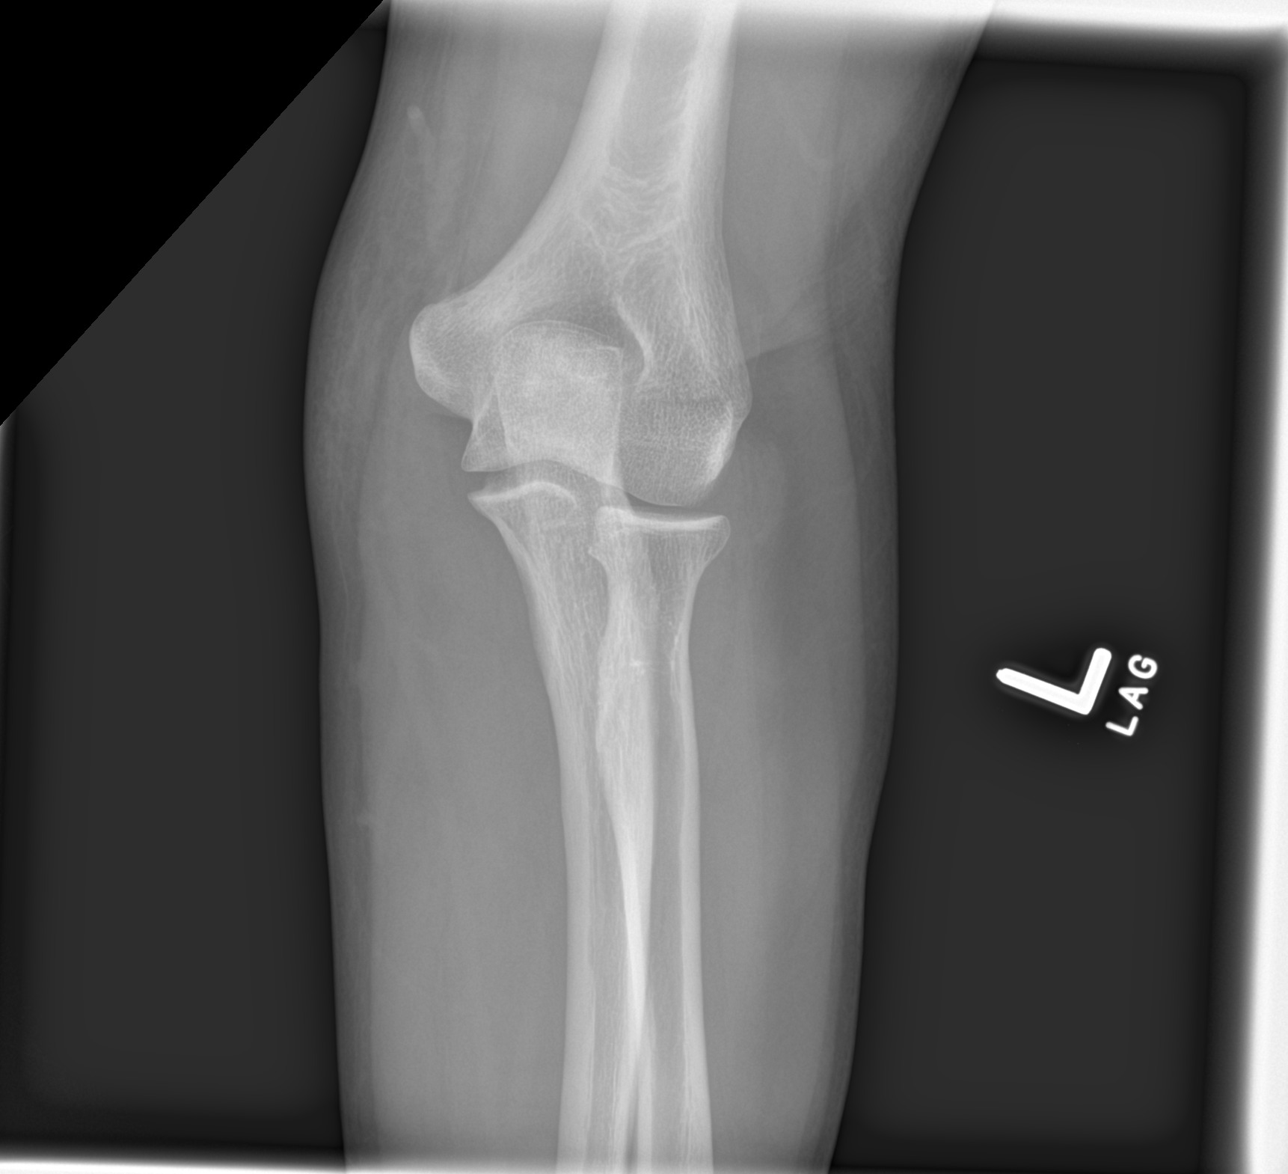

[abdomen kub (2 of 4)]
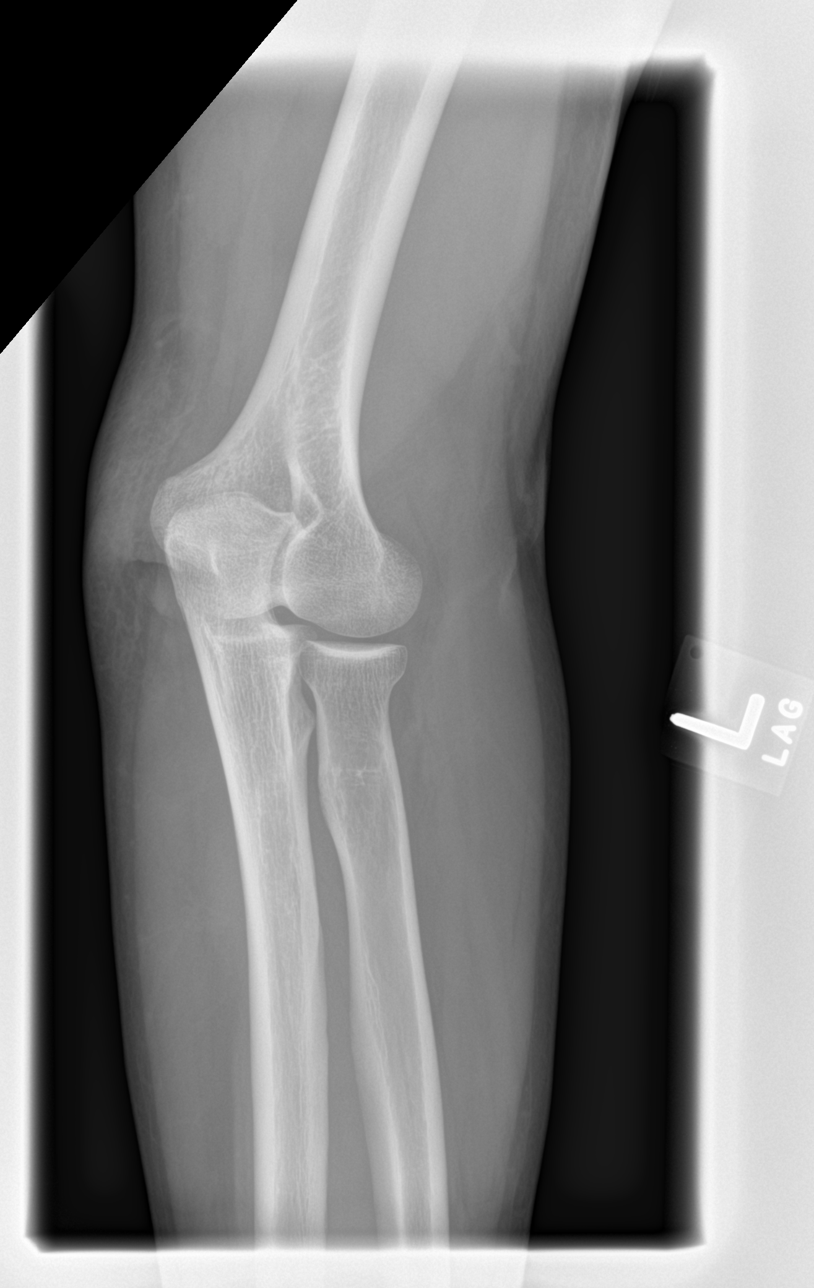

[abdomen kub (3 of 4)]
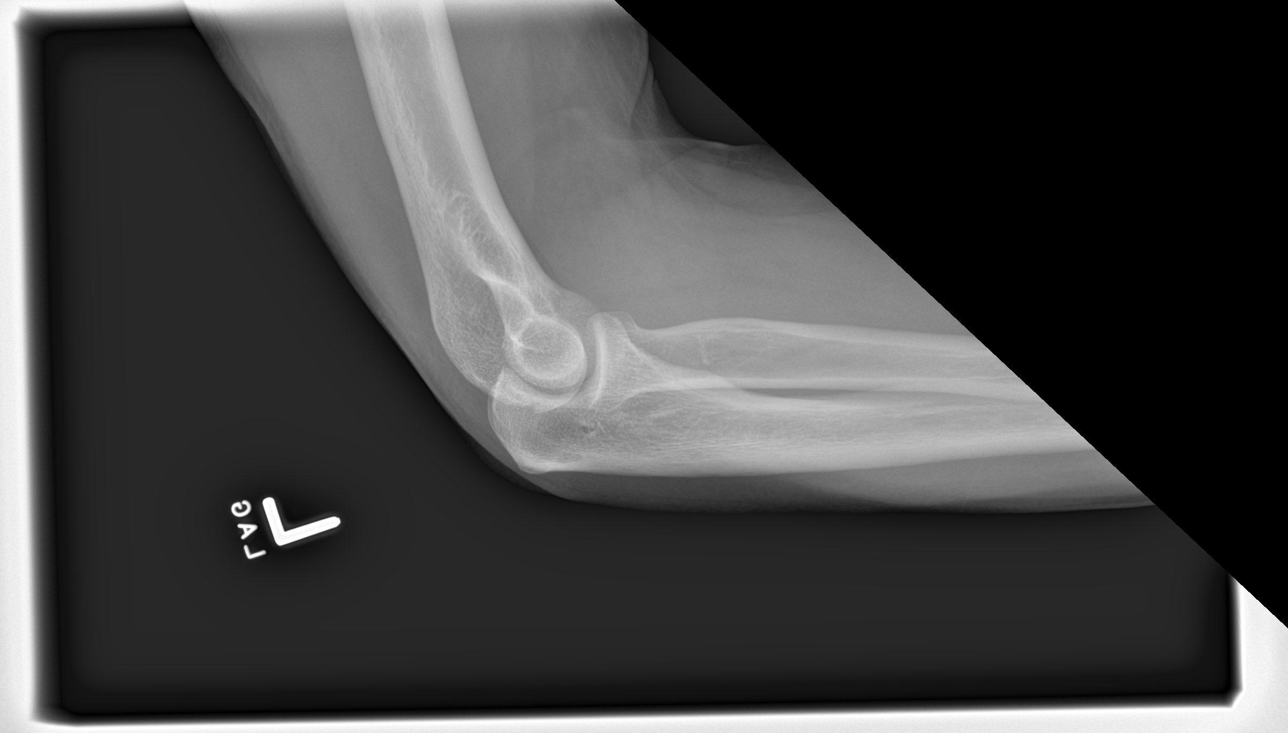

[abdomen kub (4 of 4)]
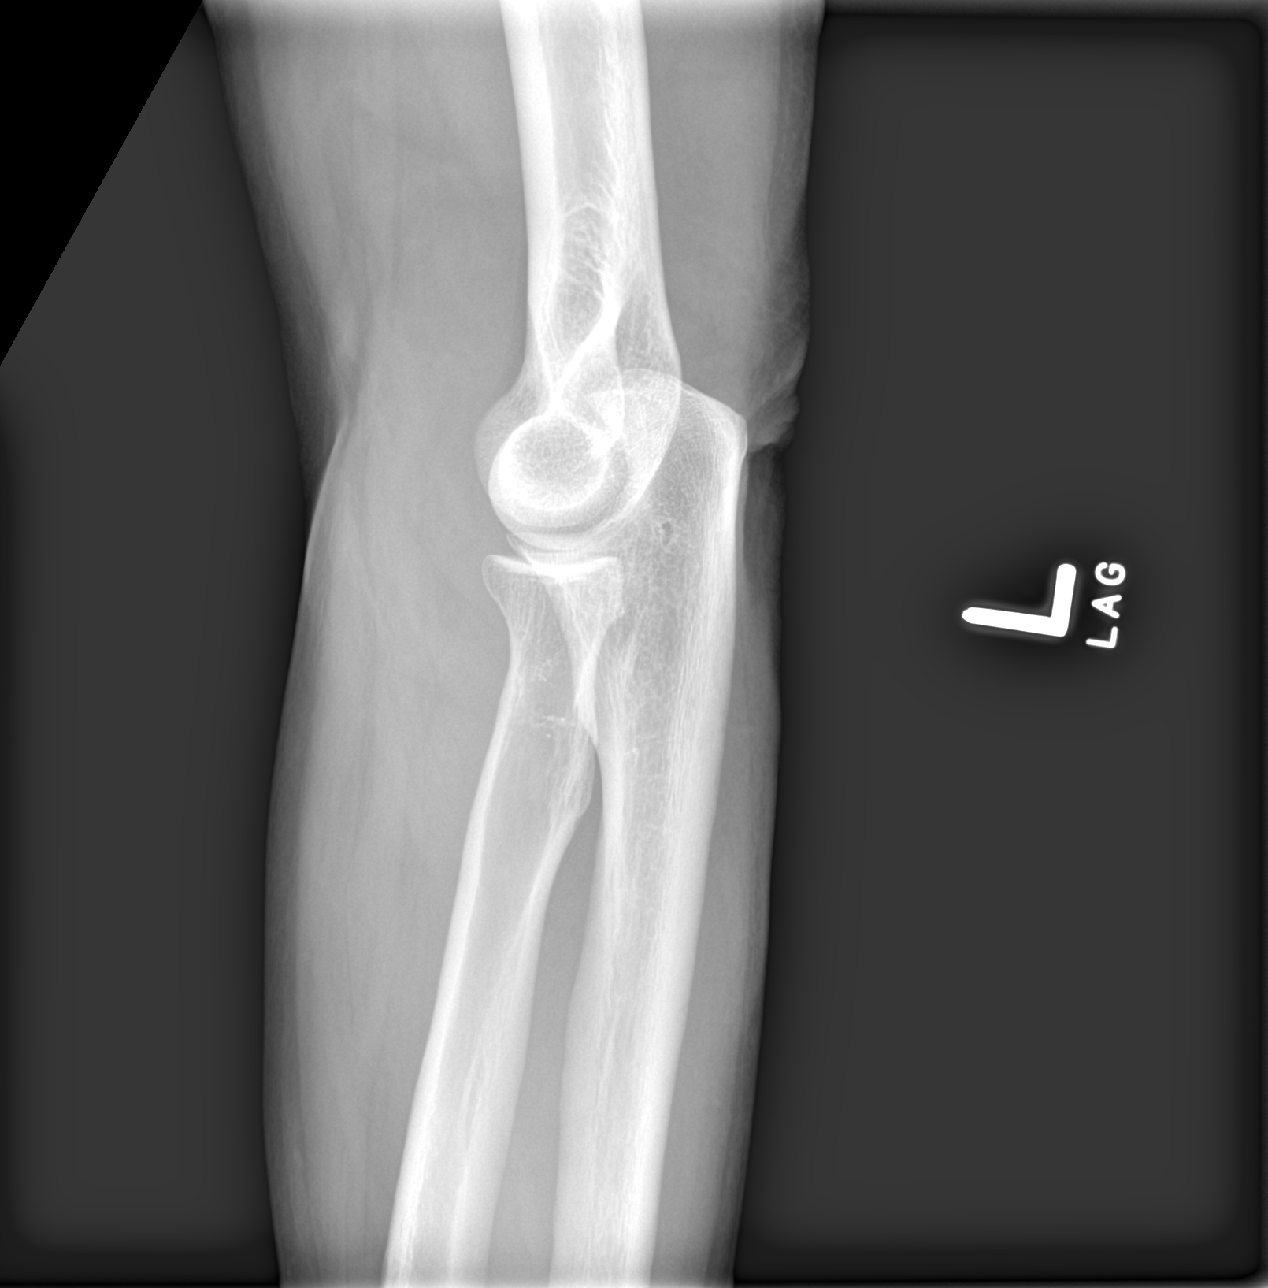

[4 of 4 positions shown; findings below may reference images not displayed]

FINDINGS: There is no evidence of fracture, dislocation, or joint effusion.
There is no evidence of arthropathy or other focal bone abnormality.
Soft tissues are unremarkable.
IMPRESSION: Negative.

## 2020-08-17 ENCOUNTER — Other Ambulatory Visit: Payer: Self-pay

## 2020-08-17 ENCOUNTER — Emergency Department (HOSPITAL_COMMUNITY)
Admission: EM | Admit: 2020-08-17 | Discharge: 2020-08-17 | Disposition: A | Discharge status: Home / Self Care | Attending: Emergency Medicine | Admitting: Emergency Medicine

## 2020-08-17 ENCOUNTER — Emergency Department (HOSPITAL_COMMUNITY)

## 2020-08-17 ENCOUNTER — Encounter (HOSPITAL_COMMUNITY): Payer: Self-pay

## 2020-08-17 DIAGNOSIS — R0789 Other chest pain: Secondary | ICD-10-CM

## 2020-08-17 DIAGNOSIS — R079 Chest pain, unspecified: Secondary | ICD-10-CM | POA: Diagnosis present

## 2020-08-17 DIAGNOSIS — F17200 Nicotine dependence, unspecified, uncomplicated: Secondary | ICD-10-CM | POA: Insufficient documentation

## 2020-08-17 DIAGNOSIS — I2510 Atherosclerotic heart disease of native coronary artery without angina pectoris: Secondary | ICD-10-CM | POA: Diagnosis not present

## 2020-08-17 HISTORY — DX: Atherosclerotic heart disease of native coronary artery without angina pectoris: I25.10

## 2020-08-17 LAB — CBC
HCT: 44.2 % (ref 39.0–52.0)
Hemoglobin: 14.8 g/dL (ref 13.0–17.0)
MCH: 31.2 pg (ref 26.0–34.0)
MCHC: 33.5 g/dL (ref 30.0–36.0)
MCV: 93.1 fL (ref 80.0–100.0)
Platelets: 273 10*3/uL (ref 150–400)
RBC: 4.75 MIL/uL (ref 4.22–5.81)
RDW: 12.5 % (ref 11.5–15.5)
WBC: 6.5 10*3/uL (ref 4.0–10.5)
nRBC: 0 % (ref 0.0–0.2)

## 2020-08-17 LAB — BASIC METABOLIC PANEL
Anion gap: 5 (ref 5–15)
BUN: 9 mg/dL (ref 6–20)
CO2: 32 mmol/L (ref 22–32)
Calcium: 9.7 mg/dL (ref 8.9–10.3)
Chloride: 102 mmol/L (ref 98–111)
Creatinine, Ser: 0.78 mg/dL (ref 0.61–1.24)
GFR calc Af Amer: >60 mL/min (ref >60)
GFR calc non Af Amer: >60 mL/min (ref >60)
Glucose, Bld: 110 mg/dL — ABNORMAL HIGH (ref 70–99)
Potassium: 4.9 mmol/L (ref 3.5–5.1)
Sodium: 139 mmol/L (ref 135–145)

## 2020-08-17 LAB — TROPONIN I (HIGH SENSITIVITY)
Troponin I (High Sensitivity): <2 ng/L (ref <18)
Troponin I (High Sensitivity): <2 ng/L (ref <18)

## 2020-08-17 MED ORDER — IBUPROFEN 800 MG PO TABS: 800.0000 mg | ORAL_TABLET | Freq: Four times a day (QID) | ORAL | 0 refills | Status: DC | PRN

## 2020-08-17 MED ORDER — ACETAMINOPHEN 325 MG PO TABS
650.0000 mg | ORAL_TABLET | Freq: Once | ORAL | Status: AC
  Administered 2020-08-17: 650 mg via ORAL
  Filled 2020-08-17: qty 2

## 2020-08-17 MED ORDER — IBUPROFEN 400 MG PO TABS
600.0000 mg | ORAL_TABLET | Freq: Once | ORAL | Status: AC
  Administered 2020-08-17: 600 mg via ORAL
  Filled 2020-08-17: qty 1

## 2020-08-17 NOTE — ED Triage Notes (Signed)
[facility] for CP. patient received baby ASA and 2 SL NTG prior to arrival with some relief. Currently rates pain 4/10

## 2020-08-17 NOTE — ED Notes (Signed)
Pt called for vitals no response x3

## 2020-08-17 NOTE — ED Notes (Signed)
Pt called by radiology and registration with no response.

## 2020-08-17 NOTE — ED Provider Notes (Signed)
[facility] EMERGENCY DEPARTMENT Provider Note   CSN: [id number] Arrival date & time: 08/17/20  1417     History Chief Complaint  Patient presents with  . Chest Pain    Zakary Kimura is a 39 y.o. male.  The history is provided by the patient.  Chest Pain Pain location:  L chest Pain quality: aching   Pain radiates to:  Does not radiate Pain severity:  Mild Onset quality:  Gradual Timing:  Intermittent Progression:  Partially resolved Chronicity:  New Context: movement   Relieved by:  Nothing Worsened by:  Nothing Associated symptoms: no abdominal pain, no back pain, no cough, no fever, no palpitations, no shortness of breath and no vomiting   Risk factors: high cholesterol   Risk factors: no diabetes mellitus and no hypertension        Past Medical History:  Diagnosis Date  . Coronary artery disease   . Depression   . Hypercholesterolemia   . Substance abuse [facility])     Patient Active Problem List   Diagnosis Date Noted  . MDD (major depressive disorder), recurrent severe, without psychosis (HCC) 03/17/2019  . Methamphetamine-induced psychotic disorder (HCC)   . 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 SURGERY SCROTAL / TESTICULAR         No family history on file.  Social History   Tobacco Use  . Smoking status: Current Every Day Smoker    Packs/day: 1.00  . Smokeless tobacco: Former Clinical biochemist  . Vaping Use: Never used  Substance Use Topics  . Alcohol use: Not Currently  . Drug use: Yes    Types: Amphetamines, Methamphetamines    Home Medications Prior to Admission medications   Medication Sig Start Date End Date Taking? Authorizing Provider  doxycycline (VIBRAMYCIN) 100 MG capsule Take 1 capsule (100 mg total) by mouth 2 (two) times daily. 12/27/19   Molpus,  John, MD  erythromycin ophthalmic ointment Place a 1/2 inch ribbon of ointment into the lower eyelid. 01/04/20   Gailen Shelter, PA  ibuprofen (ADVIL) 800 MG tablet Take 1 tablet (800 mg total) by mouth every 6 (six) hours as needed for up to 20 doses for moderate pain. 08/17/20   Sawyer Mentzer, DO  trifluridine (VIROPTIC) 1 % ophthalmic solution Place 1 drop into the left eye every 4 (four) hours. 01/04/20   Gailen Shelter, PA  gabapentin (NEURONTIN) 300 MG capsule Take 1 capsule (300 mg total) by mouth 3 (three) times daily. Patient not taking: Reported on 11/27/2019 03/22/19 12/27/19  Malvin Johns, MD  iloperidone (FANAPT) 4 MG TABS tablet Take 1 tablet (4 mg total) by mouth 3 (three) times daily. Patient not taking: Reported on 11/27/2019 03/22/19 12/27/19  Malvin Johns, MD  venlafaxine XR (EFFEXOR-XR) 75 MG 24 hr capsule Take 1 capsule (75 mg total) by mouth daily. Patient not taking: Reported on 11/27/2019 03/23/19 12/27/19  Malvin Johns, MD    Allergies    Patient has no known allergies.  Review of Systems   Review of Systems  Constitutional: Negative for chills and fever.  HENT: Negative for ear pain and sore throat.   Eyes: Negative for pain and visual disturbance.  Respiratory: Negative for cough and shortness of breath.   Cardiovascular: Positive for chest pain. Negative for palpitations.  Gastrointestinal: Negative for abdominal pain and vomiting.  Genitourinary: Negative  for dysuria and hematuria.  Musculoskeletal: Negative for arthralgias and back pain.  Skin: Negative for color change and rash.  Neurological: Negative for seizures and syncope.  All other systems reviewed and are negative.   Physical Exam Updated Vital Signs BP 126/80   Pulse 80   Temp 98.5 F (36.9 C) (Oral)   Resp 17   SpO2 98%   Physical Exam Vitals and nursing note reviewed.  Constitutional:      General: He is not in acute distress.    Appearance: He is well-developed.  HENT:     Head:  Normocephalic and atraumatic.  Eyes:     Conjunctiva/sclera: Conjunctivae normal.  Cardiovascular:     Rate and Rhythm: Normal rate and regular rhythm.     Pulses:          Radial pulses are 2+ on the right side and 2+ on the left side.       Dorsalis pedis pulses are 2+ on the right side and 2+ on the left side.     Heart sounds: Normal heart sounds. No murmur heard.   Pulmonary:     Effort: Pulmonary effort is normal. No respiratory distress.     Breath sounds: Normal breath sounds.  Chest:     Chest wall: Tenderness (TTP over left side of chest wall) present.  Abdominal:     Palpations: Abdomen is soft.     Tenderness: There is no abdominal tenderness.  Musculoskeletal:        General: Normal range of motion.     Cervical back: Neck supple.     Right lower leg: No edema.     Left lower leg: No edema.  Skin:    General: Skin is warm and dry.     Capillary Refill: Capillary refill takes less than 2 seconds.  Neurological:     General: No focal deficit present.     Mental Status: He is alert and oriented to person, place, and time.     Cranial Nerves: No cranial nerve deficit.     Motor: No weakness.  Psychiatric:        Mood and Affect: Mood normal.     ED Results / Procedures / Treatments   Labs (all labs ordered are listed, but only abnormal results are displayed) Labs Reviewed  BASIC METABOLIC PANEL - Abnormal; Notable for the following components:      Result Value   Glucose, Bld 110 (*)    All other components within normal limits  CBC  TROPONIN I (HIGH SENSITIVITY)  TROPONIN I (HIGH SENSITIVITY)    EKG EKG Interpretation  Date/Time:  Saturday August 17 2020 15:43:59 EDT Ventricular Rate:  85 PR Interval:  144 QRS Duration: 86 QT Interval:  348 QTC Calculation: 414 R Axis:   49 Text Interpretation: Normal sinus rhythm Normal ECG Confirmed by Virgina Norfolk [phone]) on 08/17/2020 5:47:08 PM   Radiology DG Chest 2 View  Result Date:  08/17/2020 CLINICAL DATA:  Left-sided chest pain and shortness of breath for 2 days. EXAM: CHEST - 2 VIEW COMPARISON:  None. FINDINGS: Heart is normal in size.The cardiomediastinal contours are normal. The lungs are clear. Pulmonary vasculature is normal. No consolidation, pleural effusion, or pneumothorax. No acute osseous abnormalities are seen. EKG leads overlie chest. IMPRESSION: Negative radiographs of the chest. Electronically Signed   By: Narda Rutherford M.D.   On: 08/17/2020 18:28    Procedures Procedures (including critical care time)  Medications Ordered in ED Medications  acetaminophen (TYLENOL) tablet 650 mg (650 mg Oral Given 08/17/20 1843)  ibuprofen (ADVIL) tablet 600 mg (600 mg Oral Given 08/17/20 1843)    ED Course  I have reviewed the triage vital signs and the nursing notes.  Pertinent labs & imaging results that were available during my care of the patient were reviewed by me and considered in my medical decision making (see chart for details).    MDM Rules/Calculators/A&P                          Graycen Sadlon is a 39 year old male with history of high cholesterol presents the ED with chest pain.  Normal vitals.  No fever.  Tenderness over the left side of his chest on exam.  Overall reproducible chest pain.  Heart score 1.  Does not appear to have a history of coronary artery disease per chart.  Had a negative stress test several years ago.  Sounds like he possibly had an NSTEMI in the setting of substance abuse but had a negative stress test and echocardiogram after that.  Has never had a heart cath or stent or CABG.  EKG shows sinus rhythm.  No ischemic changes.  2 troponins are negative and doubt ACS.  Patient is PERC negative.  Chest x-ray without any signs of pneumonia, no pneumothorax.  Overall suspect MSK pain.  Given Tylenol and Motrin.  Discharged in good condition.  Understands return precautions.  This chart was dictated using voice recognition software.   Despite best efforts to proofread,  errors can occur which can change the documentation meaning.    Final Clinical Impression(s) / ED Diagnoses Final diagnoses:  Atypical chest pain    Rx / DC Orders ED Discharge Orders         Ordered    ibuprofen (ADVIL) 800 MG tablet  Every 6 hours PRN        08/17/20 1843           Virgina Norfolk, DO 08/17/20 1942

## 2020-08-17 NOTE — Discharge Instructions (Addendum)
800 mg of Motrin 3 times a day as needed, work-up today was unremarkable.  No signs to suggest cardiac disease.  Suspect musculoskeletal pain.

## 2020-12-11 IMAGING — DX DG ELBOW COMPLETE 3+V*R*
4 series · 4 of 4 positions shown · non-contrast
Comparison: None.

CLINICAL DATA: Redness in the right arm.  History of drug use.

EXAM:
RIGHT ELBOW - COMPLETE 3+ VIEW

[elbow ap]
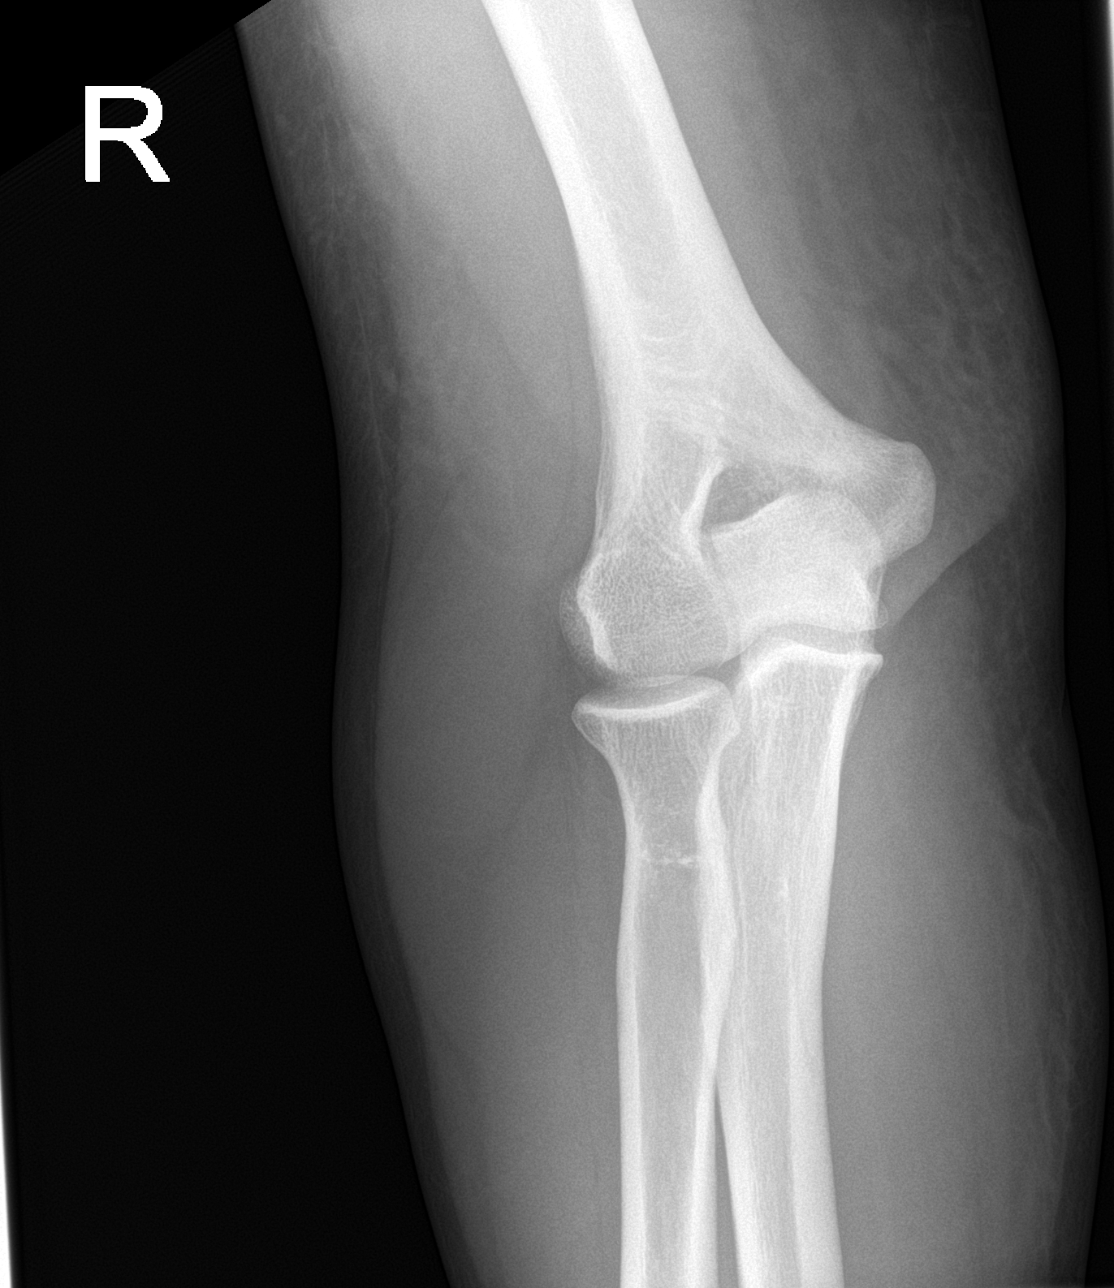

[elbow obl (1 of 2)]
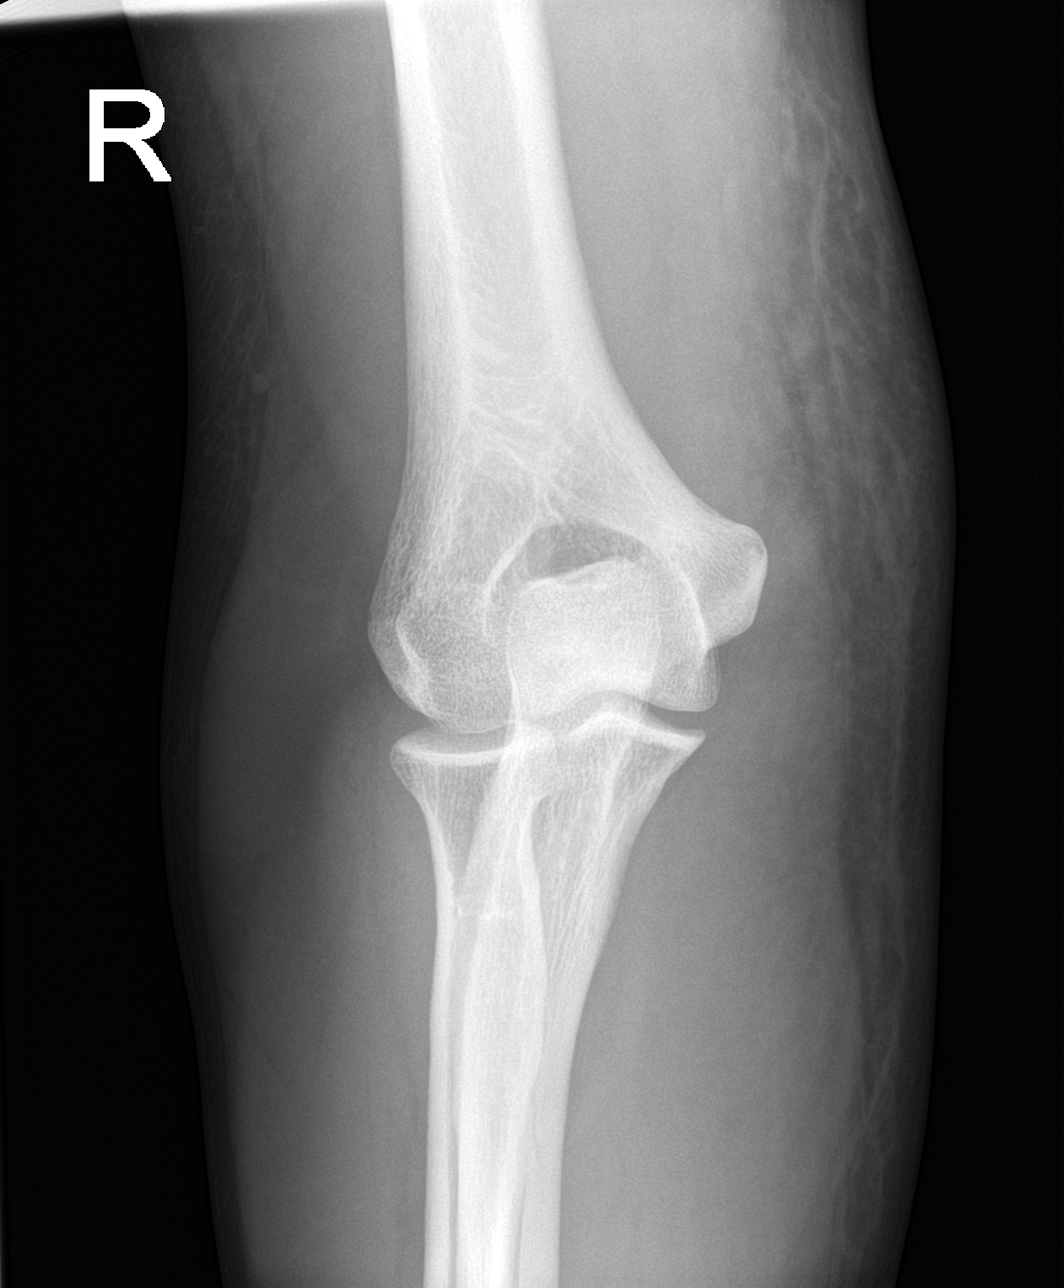

[elbow obl (2 of 2)]
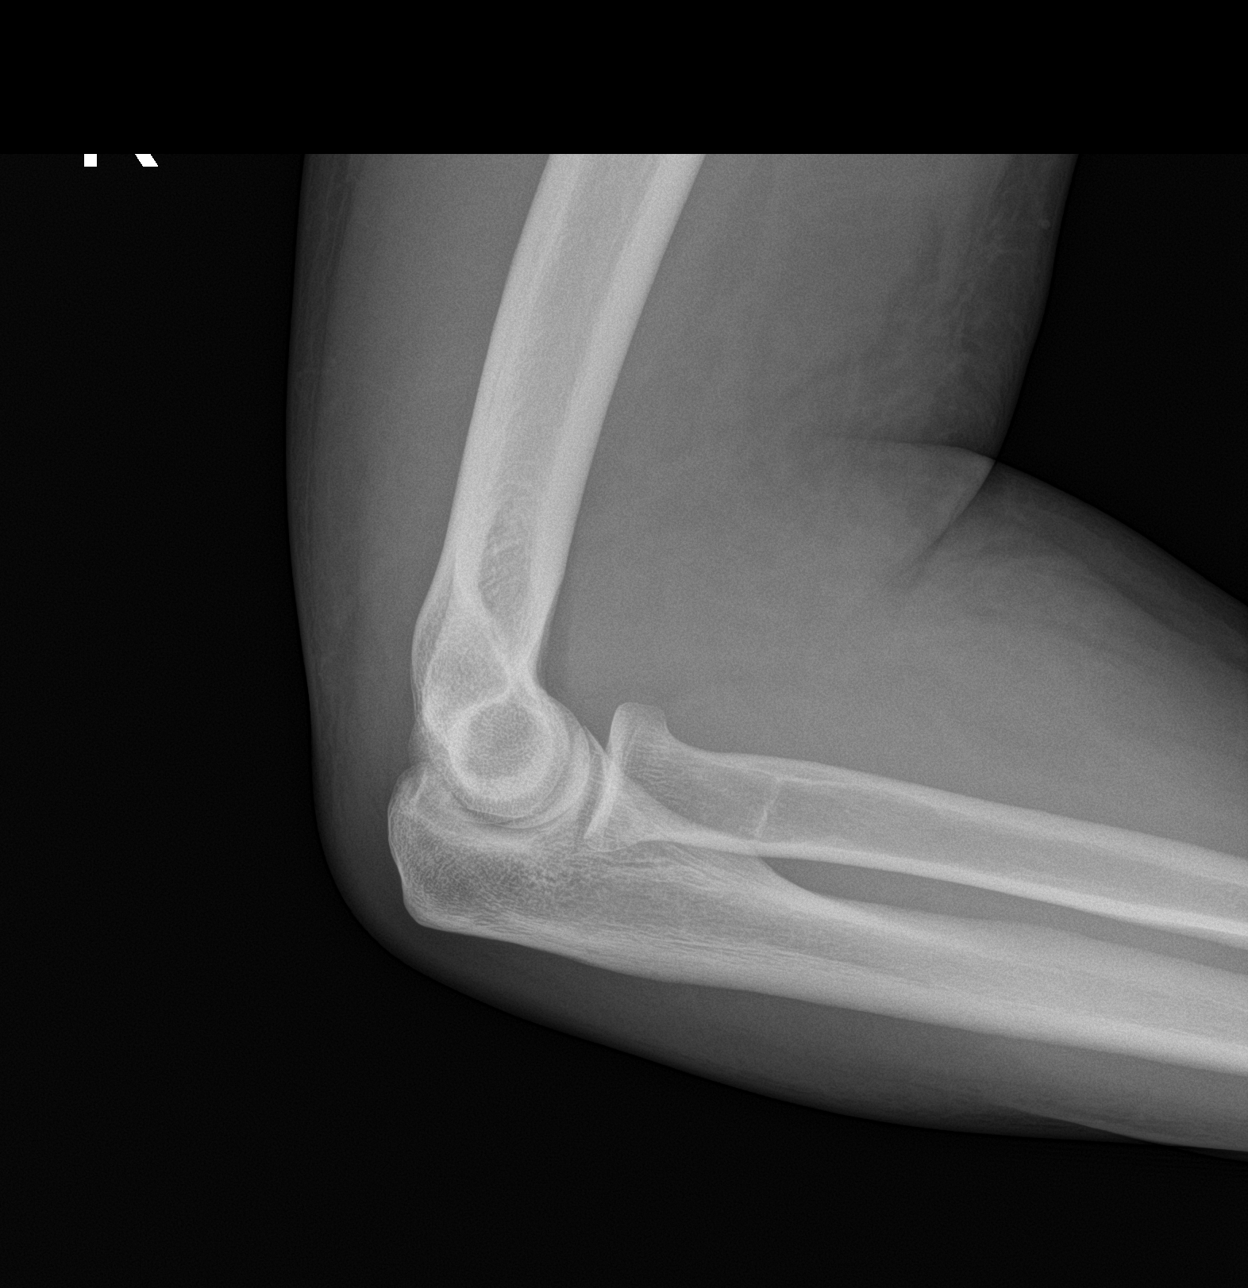

[elbow lat]
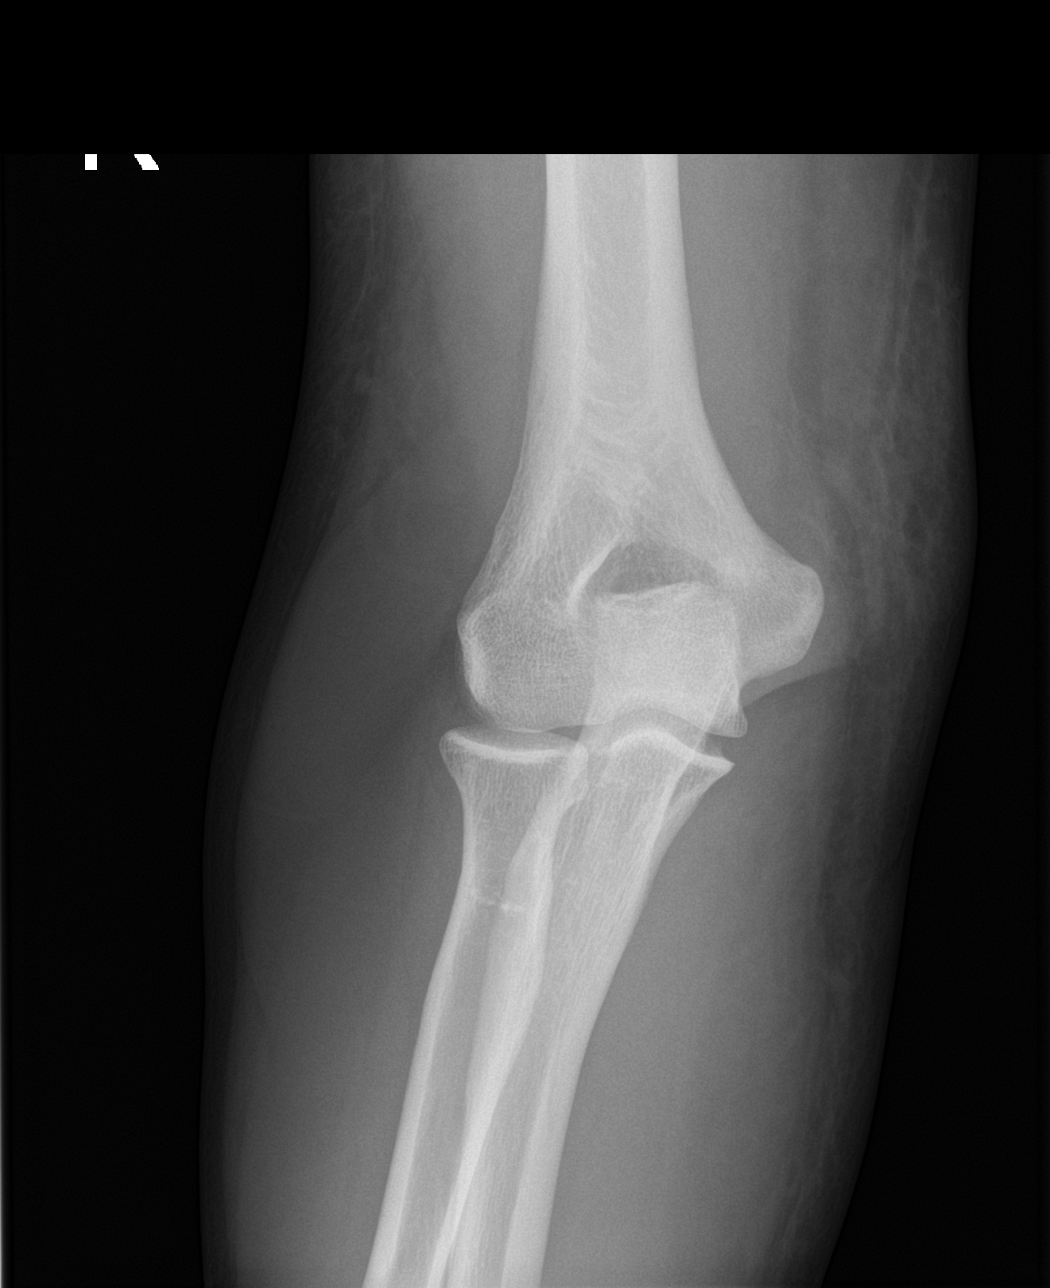

[4 of 4 positions shown; findings below may reference images not displayed]

FINDINGS: Subcutaneous reticulation along the medial more than lateral arm and
forearm. New joint effusion, fracture, or erosion.
IMPRESSION: Soft tissue swelling without opaque foreign body or gas. No osseous
abnormality or joint effusion.

## 2022-01-03 ENCOUNTER — Ambulatory Visit: Payer: Self-pay

## 2022-01-06 ENCOUNTER — Encounter (HOSPITAL_COMMUNITY): Payer: Self-pay | Admitting: Emergency Medicine

## 2022-01-06 ENCOUNTER — Other Ambulatory Visit: Payer: Self-pay

## 2022-01-06 ENCOUNTER — Ambulatory Visit (HOSPITAL_COMMUNITY)
Admission: EM | Admit: 2022-01-06 | Discharge: 2022-01-06 | Disposition: A | Discharge status: Home / Self Care | Payer: Self-pay | Attending: Emergency Medicine | Admitting: Emergency Medicine

## 2022-01-06 DIAGNOSIS — R21 Rash and other nonspecific skin eruption: Secondary | ICD-10-CM

## 2022-01-06 DIAGNOSIS — S0502XA Injury of conjunctiva and corneal abrasion without foreign body, left eye, initial encounter: Secondary | ICD-10-CM

## 2022-01-06 MED ORDER — FLUORESCEIN SODIUM 1 MG OP STRP
1.0000 | ORAL_STRIP | Freq: Once | OPHTHALMIC | Status: AC
  Administered 2022-01-06: 20:00:00 1 via OPHTHALMIC

## 2022-01-06 MED ORDER — FLUORESCEIN SODIUM 1 MG OP STRP
ORAL_STRIP | OPHTHALMIC | Status: AC
  Filled 2022-01-06: qty 1

## 2022-01-06 MED ORDER — TETRACAINE HCL 0.5 % OP SOLN
1.0000 [drp] | Freq: Once | OPHTHALMIC | Status: AC
  Administered 2022-01-06: 20:00:00 1 [drp] via OPHTHALMIC

## 2022-01-06 MED ORDER — TETRACAINE HCL 0.5 % OP SOLN
OPHTHALMIC | Status: AC
  Filled 2022-01-06: qty 4

## 2022-01-06 NOTE — Discharge Instructions (Addendum)
I have talked with Dr. Vonna Kotyk, the ophthalmologist on-call.  He wants to see you tonight at 9:30 PM.  Go to his office at [address] and call his cell phone number at [phone] and he will let you into the office.  He will determine further management.

## 2022-01-06 NOTE — ED Provider Notes (Signed)
HPI  SUBJECTIVE:  Jon Peterson is a 41 y.o. male who presents with 3 to 4 days of left eye conjunctival injection, pain, purulent and watery discharge, blurry vision, photophobia.  He reports a blistery rash around his eye.  No chemical exposure, denies trauma to the eye.  He states that this is a recurring issue, states that he gets it every 1 to 2 years, was once told that it looked like he had a herpetic lesion years ago.  He states that is usually treated with antiviral eyedrops, sometimes oral medications.  No facial, ear pain, other rash in the V1 dermatome.  No fevers, nausea, vomiting, headache.  He does not wear contacts or glasses.  He tried warm compresses with temporary improvement in his symptoms.  Symptoms are worse with exposure to light and with "messing with it".  No history of MRSA, HIV.  He has a history of coronary disease, hypercholesterolemia and a provisional diagnosis of shingles.  PMD: None.  Ophthalmology: None.    Past Medical History:  Diagnosis Date   Coronary artery disease    Depression    Hypercholesterolemia    Substance abuse (HCC)     Past Surgical History:  Procedure Laterality Date   SURGERY SCROTAL / TESTICULAR      Family History  Problem Relation Age of Onset   Healthy Mother    Healthy Father     Social History   Tobacco Use   Smoking status: Every Day    Packs/day: 1.00    Types: Cigarettes   Smokeless tobacco: Former  Building services engineer Use: Never used  Substance Use Topics   Alcohol use: Not Currently   Drug use: Yes    Types: Amphetamines, Methamphetamines    No current facility-administered medications for this encounter. No current outpatient medications on file.  No Known Allergies   ROS  As noted in HPI.   Physical Exam  BP 114/88 (BP Location: Left Arm)    Pulse (!) 102    Temp 98.8 F (37.1 C) (Oral)    Resp 20    SpO2 97%   Constitutional: Well developed, well nourished, no acute distress Eyes:  EOMI,  PERRLA, intense conjunctival injection left eye.  Positive yellowish crusting/rash medial canthus left eye.  Positive direct photophobia.  No consensual photophobia.  Large corneal ulcer/abrasion.  No periorbital erythema, edema. Visual acuity:  Left eye: to fingers Right eye 20/50 Bilateral 20/50        HENT: Normocephalic, atraumatic,mucus membranes moist.  no other facial rash, EAC normal. Respiratory: Normal inspiratory effort Cardiovascular: Normal rate GI: nondistended skin: No rash, skin intact Musculoskeletal: no deformities Neurologic: Alert & oriented x 3, no focal neuro deficits Psychiatric: Speech and behavior appropriate   ED Course   Medications  tetracaine (PONTOCAINE) 0.5 % ophthalmic solution 1 drop (1 drop Left Eye Given 01/06/22 2019)  fluorescein ophthalmic strip 1 strip (1 strip Left Eye Given 01/06/22 2019)    Orders Placed This Encounter  Procedures   Visual acuity screening    Standing Status:   Standing    Number of Occurrences:   1    No results found for this or any previous visit (from the past 24 hour(s)). No results found.  ED Clinical Impression  1. Abrasion of left cornea, initial encounter   2. Facial rash      ED Assessment/Plan  Patient has a large corneal abrasion, and a rash around his eye.  Concern for ophthalmic shingles.  Discussed with Dr. Vonna Kotyk, ophthalmology on-call.  He will see him tonight at 2130 at his office for further evaluation and management.  Patient is to call his cell phone at [phone], and  he will let him in.   Discussed plan with patient, he agrees with plan.  Meds ordered this encounter  Medications   tetracaine (PONTOCAINE) 0.5 % ophthalmic solution 1 drop   fluorescein ophthalmic strip 1 strip      *This clinic note was created using Scientist, clinical (histocompatibility and immunogenetics). Therefore, there may be occasional mistakes despite careful proofreading.  ?    Domenick Gong, MD 01/06/22 2103

## 2022-01-06 NOTE — ED Triage Notes (Signed)
Patient has a red left eye, sensitive to light.  This has been present for 4 days.  Patient has been told in the past that this resembles a herpetic infection.

## 2022-01-09 ENCOUNTER — Other Ambulatory Visit: Payer: Self-pay

## 2022-01-09 MED ORDER — VALACYCLOVIR HCL 500 MG PO TABS
500.0000 mg | ORAL_TABLET | Freq: Three times a day (TID) | ORAL | 0 refills | Status: AC
  Filled 2022-01-09: qty 30, 10d supply, fill #0

## 2022-01-14 ENCOUNTER — Ambulatory Visit (HOSPITAL_COMMUNITY)
Admission: EM | Admit: 2022-01-14 | Discharge: 2022-01-14 | Disposition: A | Discharge status: Home / Self Care | Payer: No Payment, Other | Attending: Family | Admitting: Family

## 2022-01-14 DIAGNOSIS — F1594 Other stimulant use, unspecified with stimulant-induced mood disorder: Secondary | ICD-10-CM

## 2022-01-14 DIAGNOSIS — F1514 Other stimulant abuse with stimulant-induced mood disorder: Secondary | ICD-10-CM | POA: Insufficient documentation

## 2022-01-14 NOTE — ED Provider Notes (Signed)
Behavioral Health Urgent Care Medical Screening Exam  Patient Name: Jon Peterson MRN: [id number] Date of Evaluation: 01/14/22 Chief Complaint:   Diagnosis:  Final diagnoses:  Amphetamine-induced mood disorder (HCC)    History of Present illness: Jon PiChristopher Peterson is a 41 y.o. male. [facility]in assessment.  Patient is transported by police after he contacted mobile crisis emergency telephone line.  Jon Peterson reports recent stressors include residing with a male who he "feigns a relationship with."  Patient reports he has recently moved with this male for several months.  He reports she often threatens to kick him out of the home.  He reports he believes this is related to her feeling that she should "control the situation."  Patient reports she typically makes these threatening statements to kick him out of the home after substances are ingested by both parties.  He reports she again made a statement that she would kick him out of the home last night and he decided it was time to leave the home.  He called for mobile crisis as he needed assistance.  He reports "I did not have anywhere else to go."  Jon Peterson endorses methamphetamine use disorder.  Last use of methamphetamine just prior to arrival this morning.  He reports ongoing methamphetamine use for several months.  At this time he would like to stop using methamphetamine.  He states "I am tired of living this way, I can get it great job but then I  lose it and no time because of substance use.  Jon Peterson most recently [place] substance use disorder in [place] approximately 2 months ago.  He reports he went to this program voluntarily but decided to leave after only 1-1/2-day.  Patient is assessed face-to-face by nurse practitioner.  He is seated in assessment area, no acute distress.  He is alert and oriented, pleasant and cooperative during  assessment.   He presents with euthymic mood, congruent affect. He denies suicidal ideations.  He endorses passive, vague, homicidal ideation.  Denies plan or intent.  He reports he has thought of hurting district attorney's and judges.  Additionally he has thoughts of hurting his mother and his ex-wife in the past.  He denies homicidal ideations currently.  He has not acted violently toward others in the past, per his report. He denies history of suicide attempts, denies history of self-harm.  He contracts verbally for safety with this Clinical research associatewriter.  Jon Peterson has normal speech and behavior.  He denies both auditory and visual hallucinations.  Patient is able to converse coherently with goal-directed thoughts and no distractibility or preoccupation.  He denies paranoia.  Objectively there is no evidence of psychosis/mania or delusional thinking.  Patient most recently resides in [place].  He denies access to weapons.  He is not currently employed.  Patient endorses average sleep and appetite.  He denies substance use aside from methamphetamine.  Patient offered support and encouragement.  He is interested in residential substance use treatment at this time.    Psychiatric Specialty Exam  Presentation  General Appearance:Casual; Appropriate for Environment  Eye Contact:Fair  Speech:Normal Rate; Clear and Coherent  Speech Volume:Normal  Handedness:Right   Mood and Affect  Mood:Depressed  Affect:Congruent; Depressed   Thought Process  Thought Processes:Coherent; Goal Directed; Linear  Descriptions of Associations:Intact  Orientation:Full (Time, Place and Person)  Thought Content:Logical; WDL  Diagnosis of Schizophrenia or Schizoaffective disorder in past: No   Hallucinations:None  Ideas of Reference:None  Suicidal Thoughts:No  Homicidal Thoughts:Yes, Passive Without Intent; Without Plan; Without Means to Carry Out   Sensorium  Memory:Immediate Good; Recent  [place]  Insight:Fair   Executive Functions  Concentration:Good  Attention Span:Good  Jon Peterson  Language:Good   Psychomotor Activity  Psychomotor Activity:Normal   Assets  Assets:Communication Skills; Intimacy; Leisure Time; Physical Health; Resilience; Social Support   Sleep  Sleep:Good  Number of hours: No data recorded  Nutritional Assessment (For OBS and FBC admissions only) Has the patient had a weight loss or gain of 10 pounds or more in the last 3 months?: No Has the patient had a decrease in food intake/or appetite?: No Does the patient have dental problems?: No Does the patient have eating habits or behaviors that may be indicators of an eating disorder including binging or inducing vomiting?: No Has the patient recently lost weight without trying?: 0 Has the patient been eating poorly because of a decreased appetite?: 0 Malnutrition Screening Tool Score: 0    Physical Exam: Physical Exam Vitals and nursing note reviewed.  Constitutional:      Appearance: Normal appearance. He is well-developed and normal weight.  HENT:     Head: Normocephalic and atraumatic.     Nose: Nose normal.  Eyes:     General:        Left eye: Discharge present.    Comments: Patient complains of watery discharge to LEFT eye- currently completing treatment regimen including ophthalmic prescription medicated drops- eye drops in belongings  Cardiovascular:     Rate and Rhythm: Normal rate.  Pulmonary:     Effort: Pulmonary effort is normal.  Musculoskeletal:        General: Normal range of motion.     Cervical back: Normal range of motion.  Skin:    General: Skin is warm and dry.  Neurological:     Mental Status: He is alert and oriented to person, place, and time.  Psychiatric:        Attention and Perception: Attention and perception normal.        Mood and Affect: Affect normal. Mood is depressed.        Speech: Speech normal.         Behavior: Behavior normal. Behavior is cooperative.        Thought Content: Thought content includes homicidal ideation.        Cognition and Memory: Cognition and memory normal.        Judgment: Judgment normal.   Review of Systems  Constitutional: Negative.   HENT: Negative.    Eyes: Negative.   Respiratory: Negative.    Cardiovascular: Negative.   Gastrointestinal: Negative.   Genitourinary: Negative.   Musculoskeletal: Negative.   Skin: Negative.   Neurological: Negative.   Endo/Heme/Allergies: Negative.   Psychiatric/Behavioral:  Positive for depression and substance abuse.   Blood pressure 120/81, pulse (!) 106, temperature 97.9 F (36.6 C), temperature source Oral, resp. rate 18, SpO2 99 %. There is no height or weight on file to calculate BMI.  Musculoskeletal: Strength & Muscle Tone: within normal limits Gait & Station: normal Patient leans: N/A   [place] [facility] Discharge Disposition for Follow up and Recommendations: Based on my evaluation the patient does not appear to have an emergency medical condition and can be discharged with resources and follow up care in outpatient services for Medication Management, Substance Abuse Intensive Outpatient Program, and Individual Therapy Patient reviewed with Dr Serafina Mitchell. Follow up with outpatient psychiatry resources provided.   Patient  has completed telephone assessment with [facility] in [facility] and telephone interview with [facility] in [place]. Plan tot follow up with substance use treatment resources once discharged. Housing Pension scheme manager resources provided.     Lucky Rathke, FNP 01/14/2022, 11:04 AM

## 2022-01-14 NOTE — ED Notes (Signed)
Pt discharged in no acute distress. Verbalized understanding of instructions reviewed on AVS. Belongings returned to pt intact. Pt escorted to lobby via staff with bus pass in hand. Safety maintained.

## 2022-01-14 NOTE — Discharge Instructions (Addendum)
Patient is instructed prior to discharge to: ? Take all medications as prescribed by his/her mental healthcare provider. ?Report any adverse effects and or reactions from the medicines to his/her outpatient provider promptly. ?Keep all scheduled appointments, to ensure that you are getting refills on time and to avoid any interruption in your medication.  If you are unable to keep an appointment call to reschedule.  Be sure to follow-up with resources and follow-up appointments provided.  ?Patient has been instructed & cautioned: To not engage in alcohol and or illegal drug use while on prescription medicines. ?In the event of worsening symptoms, patient is instructed to call the crisis hotline, 911 and or go to the nearest ED for appropriate evaluation and treatment of symptoms. ?To follow-up with his/her primary care provider for your other medical issues, concerns and or health care needs. ?  ? ?Homeless Shelter List: ? ?  ? ?[facility] ([facility]) ? ?[address] [place], [place] ? ?Phone: [phone] ? ?  ? ?[facility] ? ?[address], [place], [place] [address] ? ?Phone: [phone] ? ?  ? ?[facility] (Women only) ? ?[address], [place], [place] [address] ? ?Phone: [phone] ? ?  ? ?[facility] ? ?[address] [place], [place] [address] ? ?Phone: [phone] ? ?  ? ?[facility]: ? ?[address] ? ?[place], [place] [address] ? ?Phone: [phone] ? ?  ? ?[facility] ? ?[address], [place],  [address] ? ?(Check in at 6:00PM for placement at a local shelter) ? ?Phone: [phone] ? ?

## 2022-01-14 NOTE — BH Assessment (Signed)
Comprehensive Clinical Assessment (CCA) Screening, Triage and Referral Note  01/14/2022 Jon Peterson [id number]  Disposition Per Beatriz Stallion, NP, patient is psychiatrically cleared and recommended for discharge.   [place] ED from 01/14/2022 in [facility] ED from 01/06/2022 in [facility] ED from 03/15/2019 in [place] DEPT  C-SSRS RISK CATEGORY No Risk No Risk High Risk      The patient demonstrates the following risk factors for suicide: Chronic risk factors for suicide include: substance use disorder. Acute risk factors for suicide include: unemployment and loss (financial, interpersonal, professional). Protective factors for this patient include: hope for the future. Considering these factors, the overall suicide risk at this point appears to be low. Patient is appropriate for outpatient follow up.  Patient is oriented to person, place, and situation. Patient eye contact is fleeting, speech is normal. Patients affect and mood is appropriate. Patient denies SI, and reports passive, vague, thoughts about harming attorneys, judges, his mother, and his ex-wife in the past, however, denies plan or intent and contacts for safety. He denies homicidal ideations currently.  Patient denies AVH and SIB.   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 Legal System  What Is the Reason for Your Visit/Call Today? Pt statse he called a crisis line and that the crisis line contacted the police who brought him here. Pt denies SI and a hx of SI. He denies he's ever attempted to kill himself or that he has a plan to kill himself. Pt states he's been hospitalized 2-3x in the past for mental health concerns, the most recent of which was early in 2020 [facility] from  03/17/2019 - 03/22/2019). Pt denies AVH, NSSIB, acess to guns/weapons, or engagement with the legal system. Pt acknowledges that he's experiencing HI towards district attorneys and judges and that he's thought out plan to look up their addresses on tax records. Pt acknowleges he's been using methamphetamines since 2016. He states he typically uses 2-3 grams intravenously daily and that he last used 30 minutes prior to arriving at the [facility].  How Long Has This Been Causing You Problems? > than 6 months  What Do You Feel Would Help You the Most Today? Alcohol or Drug Use Treatment; Treatment for Depression or other mood problem; Medication(s); Housing Assistance   Have You Recently Had Any Thoughts About Hurting Yourself? No  Are You Planning to Commit Suicide/Harm Yourself At This time? No   Have you Recently Had Thoughts About [place]? Yes  Are You Planning to Harm Someone at This Time? No  Explanation: No data recorded  Have You Used Any Alcohol or Drugs in the Past 24 Hours? Yes  How Long Ago Did You Use Drugs or Alcohol? No data recorded What Did You Use and How Much? Methamphetamine used intravenously   Do You Currently Have a Therapist/Psychiatrist? No  Name of Therapist/Psychiatrist: No data recorded  Have You Been Recently Discharged From Any Office Practice or Programs? No data recorded Explanation of Discharge From Practice/Program: No data recorded   CCA Screening Triage Referral Assessment Type of Contact: Face-to-Face  Telemedicine Service Delivery:   Is this Initial or Reassessment? No data recorded Date Telepsych consult ordered in [facility]:  No data recorded Time Telepsych consult ordered in [facility]:  No data recorded Location of  Assessment: [facility] Assessment Services  Provider Location: [facility] Assessment Services   Collateral Involvement: No data recorded  Does Patient Have a Stage manager Guardian? No data recorded Name and Contact of Legal  Guardian: No data recorded If Minor and Not Living with Parent(s), Who has Custody? No data recorded Is CPS involved or ever been involved? No data recorded Is APS involved or ever been involved? No data recorded  Patient Determined To Be At Risk for Harm To Self or Others Based on Review of Patient Reported Information or Presenting Complaint? No  Method: No Plan  Availability of Means: No access or NA  Intent: Vague intent or NA  Notification Required: No need or identified person  Additional Information for Danger to Others Potential: No data recorded Additional Comments for Danger to Others Potential: No data recorded Are There Guns or Other Weapons in Your Home? No  Types of Guns/Weapons: No data recorded Are These Weapons Safely Secured?                            No data recorded Who Could Verify You Are Able To Have These Secured: No data recorded Do You Have any Outstanding Charges, Pending Court Dates, Parole/Probation? No data recorded Contacted To Inform of Risk of Harm To Self or Others: No data recorded  Does Patient Present under Involuntary Commitment? No  IVC Papers Initial File Date: No data recorded  [place] of Residence: [place]   Patient Currently Receiving the Following Services: Not Receiving Services   Determination of Need: Urgent (48 hours)   Options For Referral: Medication Management; Facility-Based Crisis; Outpatient Therapy   Discharge Disposition:  Discharge Disposition Medical Exam completed: Yes Disposition of Patient: Discharge Mode of transportation if patient is discharged/movement?: Other (comment)  [name], [facility]

## 2022-01-14 NOTE — Progress Notes (Signed)
TRIAGE: URGENT   01/14/22 0629  BHUC Triage Screening (Walk-ins at [facility] only)  How Did You Hear About [place]? Legal System  What Is the Reason for Your Visit/Call Today? Pt statse he called a crisis line and that the crisis line contacted the police who brought him here. Pt denies SI and a hx of SI. He denies he's ever attempted to kill himself or that he has a plan to kill himself. Pt states he's been hospitalized 2-3x in the past for mental health concerns, the most recent of which was early in 2020 [facility] from 03/17/2019 - 03/22/2019). Pt denies AVH, NSSIB, acess to guns/weapons, or engagement with the legal system. Pt acknowledges that he's experiencing HI towards district attorneys and judges and that he's thought out plan to look up their addresses on tax records. Pt acknowleges he's been using methamphetamines since 2016. He states he typically uses 2-3 grams intravenously daily and that he last used 30 minutes prior to arriving at the [facility].  How Long Has This Been Causing You Problems? > than 6 months  Have You Recently Had Any Thoughts About Hurting Yourself? No  Are You Planning to Commit Suicide/Harm Yourself At This time? No  Have you Recently Had Thoughts About Hurting Someone Karolee Ohs? Yes  How long ago did you have thoughts of harming others? Currently  Are You Planning To Harm Someone At This Time? No  Are you currently experiencing any auditory, visual or other hallucinations? No  Have You Used Any Alcohol or Drugs in the Past 24 Hours? Yes  How long ago did you use Drugs or Alcohol? He last used 30 minutes prior to arriving at the [facility].  What Did You Use and How Much? Methamphetamine used intravenously  Do you have any current medical co-morbidities that require immediate attention? No  Clinician description of patient physical appearance/behavior: Pt is very soft-spoken and speaks slowly. He avoids eye contact. Pt's left eye is incredibly red.  What Do You Feel Would Help You the Most Today?  Alcohol or Drug Use Treatment;Treatment for Depression or other mood problem;Medication(s);Housing Assistance  If access to [facility] was not available, would you have sought care in the Emergency Department? Yes  Determination of Need Urgent (48 hours)  Options For Referral Medication Management;Facility-Based Crisis;Outpatient Therapy

## 2022-02-02 ENCOUNTER — Telehealth (HOSPITAL_COMMUNITY): Payer: Self-pay | Admitting: Emergency Medicine

## 2022-02-02 NOTE — BH Assessment (Signed)
Care Management - Follow Up Discharges   Writer attempted to make contact with patient today and was unsuccessful.  Phone just rang, voicemail is not set up.   Per chart review, patient was provided with outpatient resources.

## 2022-03-11 ENCOUNTER — Telehealth: Payer: Self-pay | Admitting: Physician Assistant

## 2022-03-11 ENCOUNTER — Telehealth: Payer: Self-pay

## 2022-03-11 DIAGNOSIS — L02619 Cutaneous abscess of unspecified foot: Secondary | ICD-10-CM

## 2022-03-11 DIAGNOSIS — L03115 Cellulitis of right lower limb: Secondary | ICD-10-CM

## 2022-03-11 DIAGNOSIS — L03119 Cellulitis of unspecified part of limb: Secondary | ICD-10-CM

## 2022-03-11 MED ORDER — DOXYCYCLINE HYCLATE 100 MG PO TABS
100.0000 mg | ORAL_TABLET | Freq: Two times a day (BID) | ORAL | 0 refills | Status: AC
  Filled 2022-03-11: qty 20, 10d supply, fill #0

## 2022-03-11 NOTE — Progress Notes (Signed)
?Virtual Visit Consent  ? ?Jon Peterson, you are scheduled for a virtual visit with a [facility] provider today.   ?  ?Just as with appointments in the office, your consent must be obtained to participate.  Your consent will be active for this visit and any virtual visit you may have with one of our providers in the next 365 days.   ?  ?If you have a MyChart account, a copy of this consent can be sent to you electronically.  All virtual visits are billed to your insurance company just like a traditional visit in the office.   ? ?As this is a virtual visit, video technology does not allow for your provider to perform a traditional examination.  This may limit your provider's ability to fully assess your condition.  If your provider identifies any concerns that need to be evaluated in person or the need to arrange testing (such as labs, EKG, etc.), we will make arrangements to do so.   ?  ?Although advances in technology are sophisticated, we cannot ensure that it will always work on either your end or our end.  If the connection with a video visit is poor, the visit may have to be switched to a telephone visit.  With either a video or telephone visit, we are not always able to ensure that we have a secure connection.    ? ?I need to obtain your verbal consent now.   Are you willing to proceed with your visit today?  ?  ?Jon Peterson has provided verbal consent on 03/11/2022 for a virtual visit (video or telephone). ?  ?Jon Loveless, PA-C  ? ?Date: 03/11/2022 7:58 PM ? ? ?Virtual Visit via Video Note  ? ?Jon Peterson, connected with  Jon Peterson  ([id number], 03/29/1981) on 03/11/22 at  7:45 PM EDT by a video-enabled telemedicine application and verified that I am speaking with the correct person using two identifiers. ? ?Location: ?Patient: Virtual Visit Location Patient: Home ?Provider: Virtual Visit Location Provider: Home Office ?  ?I discussed the limitations of evaluation  and management by telemedicine and the availability of in person appointments. The patient expressed understanding and agreed to proceed.   ? ?History of Present Illness: ?Jon Peterson is a 41 y.o. who identifies as a male who was assigned male at birth, and is being seen today for possible cellulitis. Having pain, swelling, redness, of both feet and right lower extremity. 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 Did not state specifically that is what caused this issue this time, but mentions it was a puncture wound that started the infection. Right leg and foot is worse than left. Symptoms started in both feet over the weekend and have been progressing since. He denies blistering, fevers, chills, nausea, and vomiting.  ? ? ?Problems:  ?Patient Active Problem List  ? Diagnosis Date Noted  ? 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 Methamphetamine-induced psychotic disorder (HCC)   ? 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  ?Allergies: No Known Allergies ?Medications:  ?Current Outpatient Medications:  ?  doxycycline (VIBRA-TABS) 100 MG tablet, Take 1 tablet (100 mg total) by mouth 2 (two) times daily., Disp: 20 tablet, Rfl: 0 ?  Besifloxacin HCl (BESIVANCE) 0.6 % SUSP, Place 1 drop into the left eye as directed., Disp: , Rfl:  ?  cyclopentolate (CYCLODRYL,CYCLOGYL) 1 %  ophthalmic solution, Place 1 drop into the left eye as directed., Disp: , Rfl:  ?  Ganciclovir (ZIRGAN) 0.15 % GEL, Place 1 drop into the left eye as directed., Disp: , Rfl:  ?  valACYclovir (VALTREX) 500 MG tablet, Take 1 tablet (500 mg total) by mouth 3 (three) times daily. (Patient taking differently: Take 500 mg by mouth daily.), Disp: 30 tablet, Rfl: 0 ? ?Observations/Objective: ?Patient is well-developed, well-nourished in no acute distress.  ?Resting comfortably at home.   ?Head is normocephalic, atraumatic.  ?No labored breathing.  ?Speech is clear and coherent with logical content.  ?Patient is alert and oriented at baseline.  ? ? ?Assessment and Plan: ?1. Cellulitis and abscess of foot ?- doxycycline (VIBRA-TABS) 100 MG tablet; Take 1 tablet (100 mg total) by mouth 2 (two) times daily.  Dispense: 20 tablet; Refill: 0 ? ?2. Cellulitis of right lower extremity ?- doxycycline (VIBRA-TABS) 100 MG tablet; Take 1 tablet (100 mg total) by mouth 2 (two) times daily.  Dispense: 20 tablet; Refill: 0 ? ?- Patient did not want to discuss long and wanted antibiotics for possible cellulitis ?- Doxycycline prescribed to offer MRSA coverage ?- Advised to outline and monitor progression ?- Discussed high risk in feet for osteomyelitis and if not improving or if continuing to worsen he should seek immediate evaluation in person at ER; voiced understanding ?- Seek in person evaluation if not improving ? ? ?Follow Up Instructions: ?I discussed the assessment and treatment plan with the patient. The patient was provided an opportunity to ask questions and all were answered. The patient agreed with the plan and demonstrated an understanding of the instructions.  A copy of instructions were sent to the patient via MyChart unless otherwise noted below.  ? ? ?The patient was advised to call back or seek an in-person evaluation if the symptoms worsen or if the condition fails to improve as anticipated. ? ?Time:  ?I spent 8 minutes with the patient via telehealth technology discussing the above problems/concerns.   ? ?Jon Loveless, PA-C ?

## 2022-03-11 NOTE — Patient Instructions (Signed)
?Nicola Girt, thank you for joining Mar Daring, PA-C for today's virtual visit.  While this provider is not your primary care provider (PCP), if your PCP is located in our provider database this encounter information will be shared with them immediately following your visit. ? ?Consent: ?(Patient) Marqueze Pea provided verbal consent for this virtual visit at the beginning of the encounter. ? ?Current Medications: ? ?Current Outpatient Medications:  ?  doxycycline (VIBRA-TABS) 100 MG tablet, Take 1 tablet (100 mg total) by mouth 2 (two) times daily., Disp: 20 tablet, Rfl: 0 ?  Besifloxacin HCl (BESIVANCE) 0.6 % SUSP, Place 1 drop into the left eye as directed., Disp: , Rfl:  ?  cyclopentolate (CYCLODRYL,CYCLOGYL) 1 % ophthalmic solution, Place 1 drop into the left eye as directed., Disp: , Rfl:  ?  Ganciclovir (ZIRGAN) 0.15 % GEL, Place 1 drop into the left eye as directed., Disp: , Rfl:  ?  valACYclovir (VALTREX) 500 MG tablet, Take 1 tablet (500 mg total) by mouth 3 (three) times daily. (Patient taking differently: Take 500 mg by mouth daily.), Disp: 30 tablet, Rfl: 0  ? ?Medications ordered in this encounter:  ?Meds ordered this encounter  ?Medications  ? doxycycline (VIBRA-TABS) 100 MG tablet  ?  Sig: Take 1 tablet (100 mg total) by mouth 2 (two) times daily.  ?  Dispense:  20 tablet  ?  Refill:  0  ?  Order Specific Question:   Supervising Provider  ?  Answer:   Noemi Chapel [[id number]]  ?  ? ?*If you need refills on other medications prior to your next appointment, please contact your pharmacy* ? ?Follow-Up: ?Call back or seek an in-person evaluation if the symptoms worsen or if the condition fails to improve as anticipated. ? ?Other Instructions ? ?Cellulitis, Adult ?Cellulitis is a skin infection. The infected area is usually warm, red, swollen, and tender. This condition occurs most often in the arms and lower legs. The infection can travel to the muscles, blood, and underlying tissue  and become serious. It is very important to get treated for this condition. ?What are the causes? ?Cellulitis is caused by bacteria. The bacteria enter through a break in the skin, such as a cut, burn, insect bite, open sore, or crack. ?What increases the risk? ?This condition is more likely to occur in people who: ?Have a weak body defense system (immune system). ?Have open wounds on the skin, such as cuts, burns, bites, and scrapes. Bacteria can enter the body through these open wounds. ?Are older than 42 years of age. ?Have diabetes. ?Have a type of long-lasting (chronic) liver disease (cirrhosis) or kidney disease. ?Are obese. ?Have a skin condition such as: ?Itchy rash (eczema). ?Slow movement of blood in the veins (venous stasis). ?Fluid buildup below the skin (edema). ?Have had radiation therapy. ?Use IV drugs. ?What are the signs or symptoms? ?Symptoms of this condition include: ?Redness, streaking, or spotting on the skin. ?Swollen area of the skin. ?Tenderness or pain when an area of the skin is touched. ?Warm skin. ?A fever. ?Chills. ?Blisters. ?How is this diagnosed? ?This condition is diagnosed based on a medical history and physical exam. You may also have tests, including: ?Blood tests. ?Imaging tests. ?How is this treated? ?Treatment for this condition may include: ?Medicines, such as antibiotic medicines or medicines to treat allergies (antihistamines). ?Supportive care, such as rest and application of cold or warm cloths (compresses) to the skin. ?Hospital care, if the condition is severe. ?The infection usually starts  to get better within 1-2 days of treatment. ?Follow these instructions at home: ?Medicines ?Take over-the-counter and prescription medicines only as told by your health care provider. ?If you were prescribed an antibiotic medicine, take it as told by your health care provider. Do not stop taking the antibiotic even if you start to feel better. ?General instructions ?Drink enough  fluid to keep your urine pale yellow. ?Do not touch or rub the infected area. ?Raise (elevate) the infected area above the level of your heart while you are sitting or lying down. ?Apply warm or cold compresses to the affected area as told by your health care provider. ?Keep all follow-up visits as told by your health care provider. This is important. These visits let your health care provider make sure a more serious infection is not developing. ?Contact a health care provider if: ?You have a fever. ?Your symptoms do not begin to improve within 1-2 days of starting treatment. ?Your bone or joint underneath the infected area becomes painful after the skin has healed. ?Your infection returns in the same area or another area. ?You notice a swollen bump in the infected area. ?You develop new symptoms. ?You have a general ill feeling (malaise) with muscle aches and pains. ?Get help right away if: ?Your symptoms get worse. ?You feel very sleepy. ?You develop vomiting or diarrhea that persists. ?You notice red streaks coming from the infected area. ?Your red area gets larger or turns dark in color. ?These symptoms may represent a serious problem that is an emergency. Do not wait to see if the symptoms will go away. Get medical help right away. Call your local emergency services (911 in the [place] Do not drive yourself to the hospital. ?Summary ?Cellulitis is a skin infection. This condition occurs most often in the arms and lower legs. ?Treatment for this condition may include medicines, such as antibiotic medicines or antihistamines. ?Take over-the-counter and prescription medicines only as told by your health care provider. If you were prescribed an antibiotic medicine, do not stop taking the antibiotic even if you start to feel better. ?Contact a health care provider if your symptoms do not begin to improve within 1-2 days of starting treatment or your symptoms get worse. ?Keep all follow-up visits as told by your  health care provider. This is important. These visits let your health care provider make sure that a more serious infection is not developing. ?This information is not intended to replace advice given to you by your health care provider. Make sure you discuss any questions you have with your health care provider. ?Document Revised: 12/11/2019 Document Reviewed: 04/21/2018 ?Elsevier Patient Education ? 2022 [place]. ? ? ? ?If you have been instructed to have an in-person evaluation today at a local Urgent Care facility, please use the link below. It will take you to a list of all of our available [facility], including address, phone number and hours of operation. Please do not delay care.  ?[facility] ? ?If you or a family member do not have a primary care provider, use the link below to schedule a visit and establish care. When you choose a [facility] primary care physician or advanced practice provider, you gain a long-term partner in health. ?Find a Primary Care Provider ? ?Learn more about [facility] in-office and virtual care options: ?[facility] ?

## 2022-03-12 ENCOUNTER — Other Ambulatory Visit: Payer: Self-pay

## 2022-03-13 ENCOUNTER — Other Ambulatory Visit: Payer: Self-pay

## 2022-09-07 ENCOUNTER — Encounter (HOSPITAL_BASED_OUTPATIENT_CLINIC_OR_DEPARTMENT_OTHER): Payer: Self-pay | Admitting: Emergency Medicine

## 2022-09-07 ENCOUNTER — Other Ambulatory Visit: Payer: Self-pay

## 2022-09-07 ENCOUNTER — Emergency Department (HOSPITAL_BASED_OUTPATIENT_CLINIC_OR_DEPARTMENT_OTHER)
Admission: EM | Admit: 2022-09-07 | Discharge: 2022-09-08 | Disposition: A | Discharge status: Home / Self Care | Payer: Commercial Managed Care - HMO | Attending: Emergency Medicine | Admitting: Emergency Medicine

## 2022-09-07 DIAGNOSIS — L0390 Cellulitis, unspecified: Secondary | ICD-10-CM

## 2022-09-07 DIAGNOSIS — I2510 Atherosclerotic heart disease of native coronary artery without angina pectoris: Secondary | ICD-10-CM | POA: Insufficient documentation

## 2022-09-07 DIAGNOSIS — L03116 Cellulitis of left lower limb: Secondary | ICD-10-CM | POA: Diagnosis not present

## 2022-09-07 DIAGNOSIS — F17210 Nicotine dependence, cigarettes, uncomplicated: Secondary | ICD-10-CM | POA: Insufficient documentation

## 2022-09-07 DIAGNOSIS — L237 Allergic contact dermatitis due to plants, except food: Secondary | ICD-10-CM

## 2022-09-07 DIAGNOSIS — R21 Rash and other nonspecific skin eruption: Secondary | ICD-10-CM | POA: Diagnosis present

## 2022-09-07 NOTE — ED Triage Notes (Addendum)
Patient arrived via POV c/o cellulitis across lower legs and rash across forearms. Patient states exposure to both flea's and poison oak/poison ivy. Patient has hx of substance abusePatient states using over the counter anti itch with no relief. Patient is AO x 4, VS WDL, normal gait.

## 2022-09-08 ENCOUNTER — Encounter (HOSPITAL_BASED_OUTPATIENT_CLINIC_OR_DEPARTMENT_OTHER): Payer: Self-pay | Admitting: Emergency Medicine

## 2022-09-08 ENCOUNTER — Other Ambulatory Visit: Payer: Self-pay

## 2022-09-08 MED ORDER — DEXAMETHASONE 4 MG PO TABS
8.0000 mg | ORAL_TABLET | Freq: Once | ORAL | Status: AC
  Administered 2022-09-08: 8 mg via ORAL
  Filled 2022-09-08: qty 2

## 2022-09-08 MED ORDER — DIPHENHYDRAMINE HCL 25 MG PO CAPS
50.0000 mg | ORAL_CAPSULE | Freq: Once | ORAL | Status: AC
  Administered 2022-09-08: 50 mg via ORAL
  Filled 2022-09-08: qty 2

## 2022-09-08 MED ORDER — CEPHALEXIN 500 MG PO CAPS
500.0000 mg | ORAL_CAPSULE | Freq: Two times a day (BID) | ORAL | 0 refills | Status: AC
  Filled 2022-09-08: qty 14, 7d supply, fill #0

## 2022-09-08 MED ORDER — DIPHENHYDRAMINE HCL 25 MG PO TABS
25.0000 mg | ORAL_TABLET | Freq: Four times a day (QID) | ORAL | 0 refills | Status: AC | PRN
  Filled 2022-09-08: qty 20, 5d supply, fill #0

## 2022-09-08 MED ORDER — CEPHALEXIN 250 MG PO CAPS
500.0000 mg | ORAL_CAPSULE | Freq: Once | ORAL | Status: AC
  Administered 2022-09-08: 500 mg via ORAL
  Filled 2022-09-08: qty 2

## 2022-09-08 MED ORDER — PREDNISONE 10 MG PO TABS
ORAL_TABLET | Freq: Every day | ORAL | 0 refills | Status: AC
  Filled 2022-09-08 (×2): qty 42, 12d supply, fill #0

## 2022-09-08 NOTE — Discharge Instructions (Signed)
It was a pleasure caring for you today in the emergency department.  Please return to the emergency department for any worsening or worrisome symptoms.  Please refrain from scratching the rash this can cause infection or make symptoms worse.

## 2022-09-08 NOTE — ED Provider Notes (Addendum)
[facility] Provider Note   CSN: [id number] Arrival date & time: 09/07/22  2044     History  Chief Complaint  Patient presents with   Cellulitis    Jon Peterson is a 41 y.o. male.  Patient as above with significant medical history as below, including polysubstance use, HLD, depression, CAD who presents to the ED with complaint of exposure to poison ivy, itchy rash to lower extremities.  Patient reports approximately 5 to 6 days ago he was doing yard work with sandals, he did clear some poison ivy.  He has been having itchy rash to his bilateral ankles and feet, bilateral forearms.  He has been using topical calamine lotion and topical hydrocortisone cream, cold compresses with mild improvement to his symptoms.  He has been scratching his lower extremities profusely over the past few days is noticed some ongoing redness and warmth to his left lower extremity primarily posterior.  No fevers or chills, no chest pain or dyspnea.  No nausea or vomiting.  No rash to areas that were not exposed to the poison ivy.  No rash to torso or groin.  No rash to face or mouth, no rash to lips or eyes.  Denies difficulty with breathing or swallowing.     Past Medical History:  Diagnosis Date   Coronary artery disease    Depression    Hypercholesterolemia    Substance abuse ([place])     Past Surgical History:  Procedure Laterality Date   SURGERY SCROTAL / TESTICULAR       The history is provided by the patient. No language interpreter was used.       Home Medications Prior to Admission medications   Medication Sig Start Date End Date Taking? Authorizing Provider  cephALEXin (KEFLEX) 500 MG capsule Take 1 capsule (500 mg total) by mouth 2 (two) times daily for 7 days. 09/08/22 09/15/22 Yes Jeanell Sparrow, DO  diphenhydrAMINE (BENADRYL) 25 MG tablet Take 1 tablet (25 mg total) by mouth every 6 (six) hours as needed for itching. 09/08/22  Yes Wynona Dove A, DO   predniSONE (STERAPRED UNI-PAK 21 TAB) 10 MG (21) TBPK tablet Take by mouth daily. Take 6 tabs by mouth daily  for 2 days, then 5 tabs for 2 days, then 4 tabs for 2 days, then 3 tabs for 2 days, 2 tabs for 2 days, then 1 tab by mouth daily for 2 days 09/08/22  Yes Jeanell Sparrow, DO  Besifloxacin HCl (BESIVANCE) 0.6 % SUSP Place 1 drop into the left eye as directed.    [provider]  cyclopentolate (CYCLODRYL,CYCLOGYL) 1 % ophthalmic solution Place 1 drop into the left eye as directed.    [provider]  doxycycline (VIBRA-TABS) 100 MG tablet Take 1 tablet (100 mg total) by mouth 2 (two) times daily. 03/11/22   Mar Daring, PA-C  Ganciclovir (ZIRGAN) 0.15 % GEL Place 1 drop into the left eye as directed.    [provider]  valACYclovir (VALTREX) 500 MG tablet Take 1 tablet (500 mg total) by mouth 3 (three) times daily. Patient taking differently: Take 500 mg by mouth daily. 01/06/22   Darleen Crocker, MD  gabapentin (NEURONTIN) 300 MG capsule Take 1 capsule (300 mg total) by mouth 3 (three) times daily. Patient not taking: Reported on 11/27/2019 03/22/19 12/27/19  Johnn Hai, MD  iloperidone (FANAPT) 4 MG TABS tablet Take 1 tablet (4 mg total) by mouth 3 (three) times daily. Patient not taking:  Reported on 11/27/2019 03/22/19 12/27/19  Johnn Hai, MD  venlafaxine XR (EFFEXOR-XR) 75 MG 24 hr capsule Take 1 capsule (75 mg total) by mouth daily. Patient not taking: Reported on 11/27/2019 03/23/19 12/27/19  Johnn Hai, MD      Allergies    Patient has no known allergies.    Review of Systems   Review of Systems  Constitutional:  Negative for chills and fever.  HENT:  Negative for facial swelling and trouble swallowing.   Eyes:  Negative for photophobia and visual disturbance.  Respiratory:  Negative for cough and shortness of breath.   Cardiovascular:  Negative for chest pain and palpitations.  Gastrointestinal:  Negative for abdominal pain, nausea and vomiting.   Endocrine: Negative for polydipsia and polyuria.  Genitourinary:  Negative for difficulty urinating and hematuria.  Musculoskeletal:  Negative for gait problem and joint swelling.  Skin:  Positive for rash and wound. Negative for pallor.  Neurological:  Negative for syncope and headaches.  Psychiatric/Behavioral:  Negative for agitation and confusion.     Physical Exam Updated Vital Signs BP 124/85 (BP Location: Right Arm)   Pulse 100   Temp 98.4 F (36.9 C) (Oral)   Resp 14   Ht 5\' 8"  (1.727 m)   Wt 90.7 kg   SpO2 100%   BMI 30.41 kg/m  Physical Exam Vitals and nursing note reviewed.  Constitutional:      General: He is not in acute distress.    Appearance: He is well-developed.  HENT:     Head: Normocephalic and atraumatic.     Comments: No rash to mucous membranes, no angioedema      Right Ear: External ear normal.     Left Ear: External ear normal.     Mouth/Throat:     Mouth: Mucous membranes are moist.  Eyes:     General: No scleral icterus. Cardiovascular:     Rate and Rhythm: Normal rate and regular rhythm.     Pulses: Normal pulses.     Heart sounds: Normal heart sounds.  Pulmonary:     Effort: Pulmonary effort is normal. No respiratory distress.     Breath sounds: Normal breath sounds.  Abdominal:     General: Abdomen is flat.     Palpations: Abdomen is soft.     Tenderness: There is no abdominal tenderness.  Musculoskeletal:        General: Normal range of motion.     Cervical back: Normal range of motion.     Right lower leg: No edema.     Left lower leg: No edema.  Skin:    General: Skin is warm and dry.     Capillary Refill: Capillary refill takes less than 2 seconds.     Findings: Rash present. Rash is crusting.     Comments: Diffuse excoriations to bilateral lower extremities, bilateral forearms.  Erythema left lower extremity posterior.  Multiple clear/yellow tint fluid-filled bullae to extremities lower c/w poison ivy  diffuse  excoriations to bilateral lower extremities  No vesicles    Neurological:     Mental Status: He is alert and oriented to person, place, and time.     GCS: GCS eye subscore is 4. GCS verbal subscore is 5. GCS motor subscore is 6.  Psychiatric:        Mood and Affect: Mood normal.        Behavior: Behavior normal.     ED Results / Procedures / Treatments   Labs (all labs ordered  are listed, but only abnormal results are displayed) Labs Reviewed - No data to display  EKG None  Radiology No results found.  Procedures Procedures    Medications Ordered in ED Medications  diphenhydrAMINE (BENADRYL) capsule 50 mg (50 mg Oral Given 09/08/22 0028)  dexamethasone (DECADRON) tablet 8 mg (8 mg Oral Given 09/08/22 0028)  cephALEXin (KEFLEX) capsule 500 mg (500 mg Oral Given 09/08/22 0028)    ED Course/ Medical Decision Making/ A&P                           Medical Decision Making Risk OTC drugs. Prescription drug management.   This patient presents to the ED with chief complaint(s) of rash with pertinent past medical history of above which further complicates the presenting complaint. The complaint involves an extensive differential diagnosis and also carries with it a high risk of complications and morbidity.    The differential diagnosis includes but not limited to dermatitis, contact dermatitis, cellulitis, shingles, SJS, TN, etc. Serious etiologies were considered.   The initial plan is to antihistamine, steroid   Additional history obtained: Additional history obtained from family Records reviewed  ED visits, outside hospital visits, prior labs and imaging  Independent labs interpretation:  The following labs were independently interpreted: Not applicable  Independent visualization of imaging: na  Treatment and Reassessment: We will give Benadryl, Decadron, Keflex >> Symptoms mildly improved  Consultation: - Consulted or discussed management/test interpretation  w/ external professional: na  Consideration for admission or further workup: Admission was considered   Well-appearing 41 year old male with history of polysubstance abuse, to the ED with poison ivy exposure.  Minimal improvement with over-the-counter anti-itch cream/cortisone/calamine lotion.  Physical exam is consistent with poison ivy dermatitis, is also possible cellulitis to left lower extremity posterior.  No streaking.  Is afebrile.  Does not appear to be septic.  We will start patient on Decadron, antihistamine, also start Keflex for possible cellulitis given history of polysubstance abuse.  Denies injection to his ankles  Patient presents with the above nonspecific rash of uncertain etiology. VSS. The patient is in no distress and there is no mucosal or oral involvement. Does not appear at this time to be erythema multiforme, bullous, SJS, TEN, shingles. No evidence at this time to suggest RMSF or endocarditis or Lyme disease. Patient looks well, nontoxic and is tolerating oral intake, no neurologic signs or symptoms, no headache or photophobia or neck pain.  The patient improved significantly and was discharged in stable condition. Detailed discussions were had with the patient regarding current findings, and need for close f/u with PCP or on call doctor. The patient has been instructed to return immediately if the symptoms worsen in any way for re-evaluation. Patient verbalized understanding and is in agreement with current care plan. All questions answered prior to discharge.    Social Determinants of health: Counseled patient for approximately 3 minutes regarding smoking cessation. Discussed risks of smoking and how they applied and affected their visit here today. Patient not ready to quit at this time, however will follow up with their primary doctor when they are.   CPT code: [id number]: intermediate counseling for smoking cessation   Social History   Tobacco Use   Smoking status:  Every Day    Packs/day: 1.00    Types: Cigarettes   Smokeless tobacco: Former  Scientific laboratory technician Use: Some days  Substance Use Topics   Alcohol use: Not Currently  Drug use: Yes    Types: Amphetamines, Methamphetamines            Final Clinical Impression(s) / ED Diagnoses Final diagnoses:  Poison ivy dermatitis  Cellulitis, unspecified cellulitis site    Rx / DC Orders ED Discharge Orders          Ordered    diphenhydrAMINE (BENADRYL) 25 MG tablet  Every 6 hours PRN        09/08/22 0024    cephALEXin (KEFLEX) 500 MG capsule  2 times daily        09/08/22 0024    predniSONE (STERAPRED UNI-PAK 21 TAB) 10 MG (21) TBPK tablet  Daily        09/08/22 0024              Jeanell Sparrow, DO 09/08/22 0103    Jeanell Sparrow, DO 09/08/22 0104

## 2023-01-14 DIAGNOSIS — R059 Cough, unspecified: Secondary | ICD-10-CM | POA: Diagnosis not present

## 2023-01-14 DIAGNOSIS — J101 Influenza due to other identified influenza virus with other respiratory manifestations: Secondary | ICD-10-CM | POA: Diagnosis not present
